# Patient Record
Sex: Female | Born: 2000 | Hispanic: Yes
Health system: Southern US, Community
[De-identification: ages and names within clinical notes are randomized; demographics above are authoritative.]

---
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
## 2021-10-18 IMAGING — US US PELVIS COMPLETE TRANSABD/TRANSVAG W DUPLEX
1 series · 12 of 25 positions shown · non-contrast
Comparison: CT 08/27/2020
COMPARISON: CT 08/27/2020

Addendum:
CLINICAL DATA: Pelvic pain.  Left adnexal mass seen on CT

EXAM:
TRANSABDOMINAL AND TRANSVAGINAL ULTRASOUND OF PELVIS
DOPPLER ULTRASOUND OF OVARIES
TECHNIQUE: Both transabdominal and transvaginal ultrasound examinations of the
pelvis were performed. Transabdominal technique was performed for
global imaging of the pelvis including uterus, ovaries, adnexal
regions, and pelvic cul-de-sac.
It was necessary to proceed with endovaginal exam following the
transabdominal exam to visualize the ovaries. Color and duplex
Doppler ultrasound was utilized to evaluate blood flow to the
ovaries.

[Series 1: us pelvic complete w transvaginal and torsion righ · 12 of 159 slices shown]
[im 7/159]
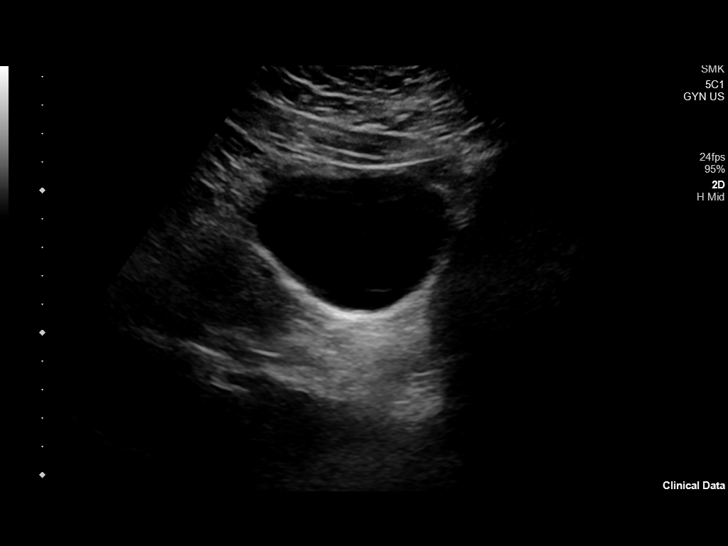
[im 20/159]
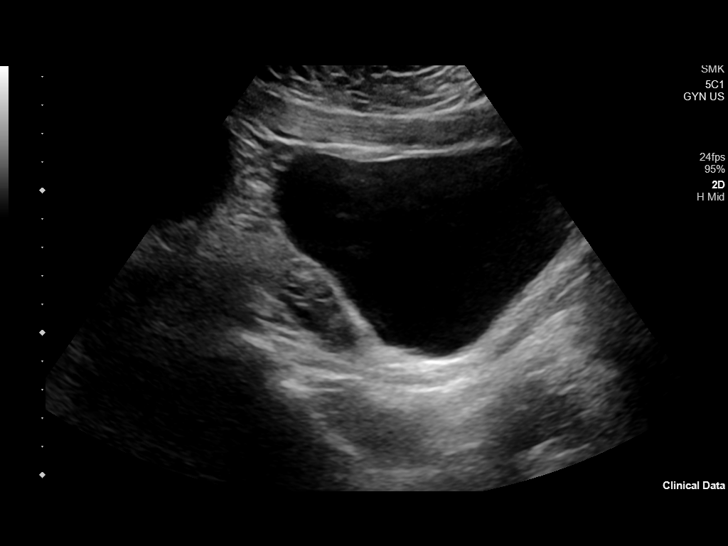
[im 33/159]
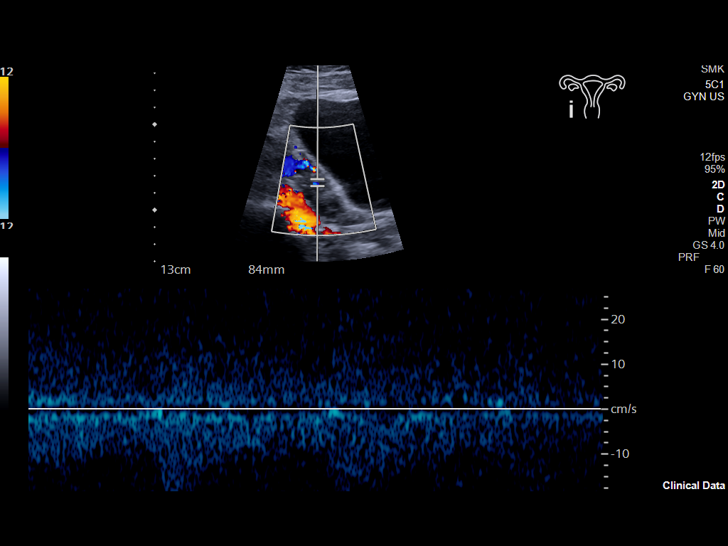
[im 47/159]
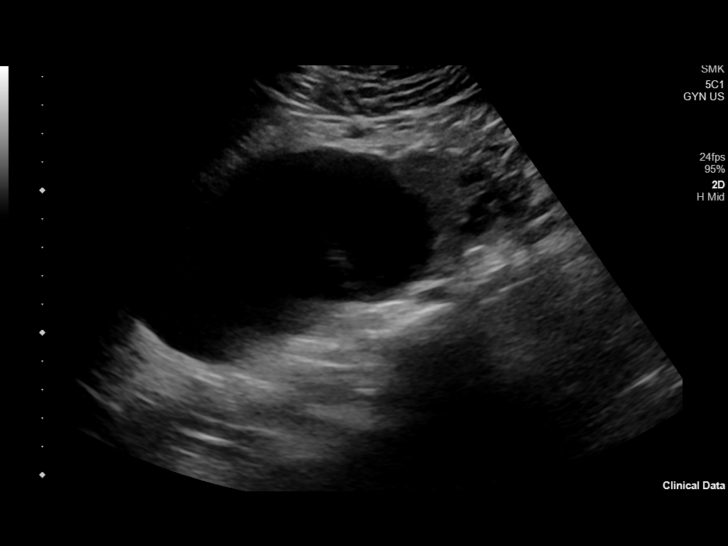
[im 60/159]
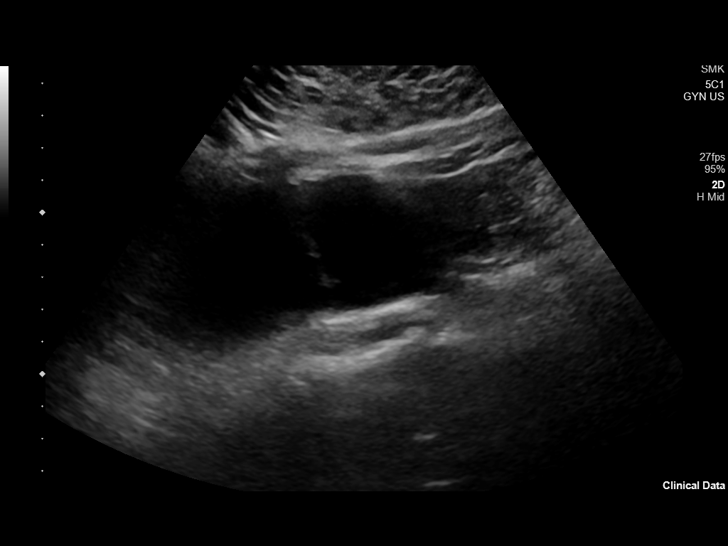
[im 73/159]
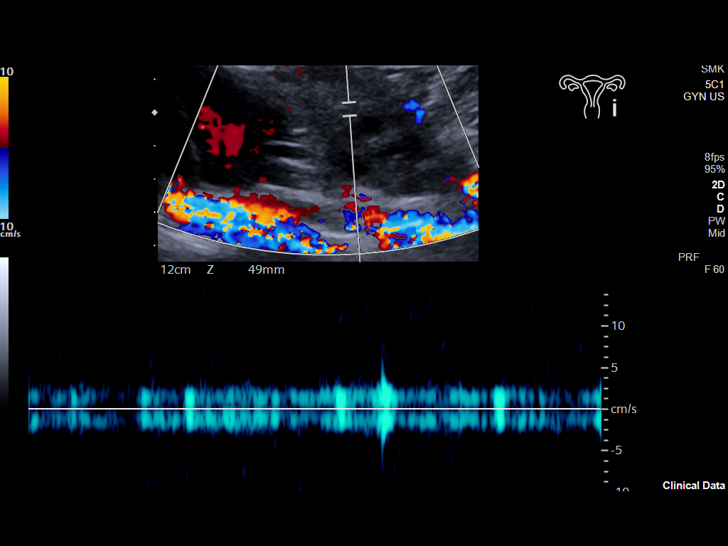
[im 86/159]
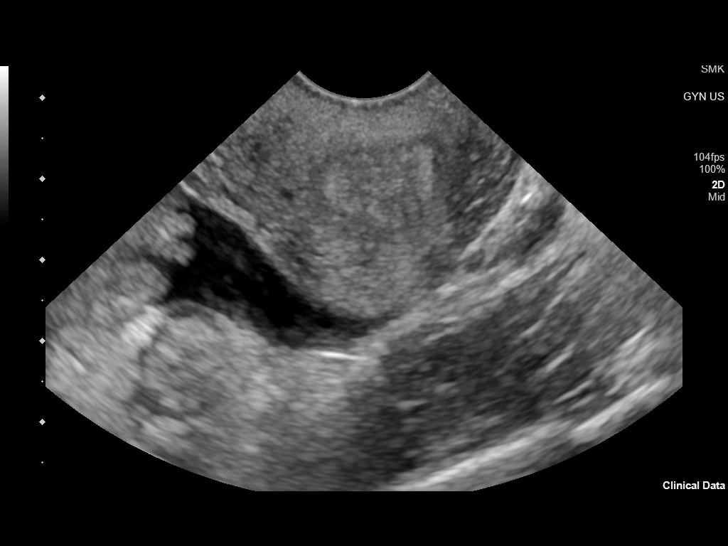
[im 99/159]
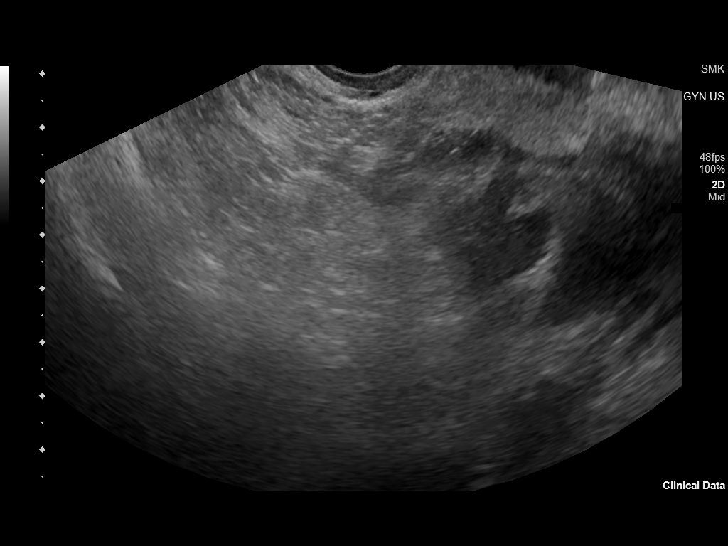
[im 112/159]
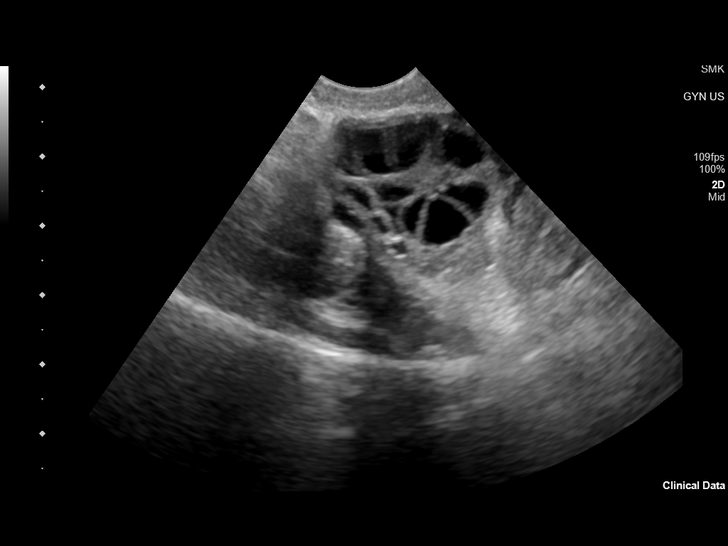
[im 126/159]
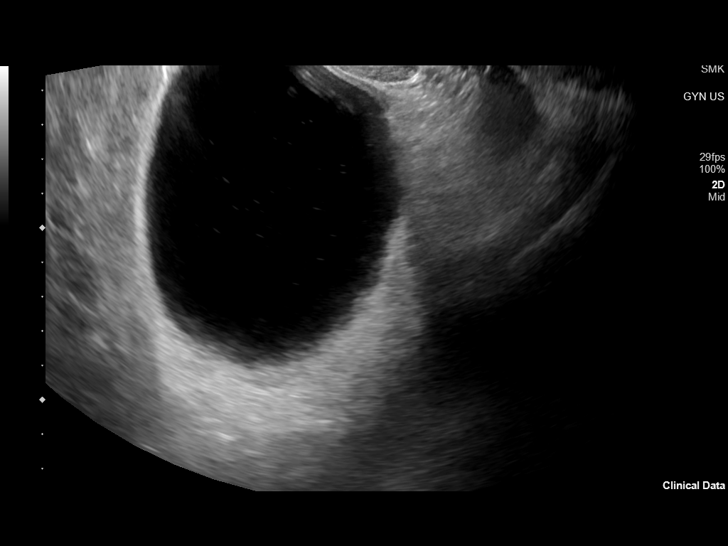
[im 139/159]
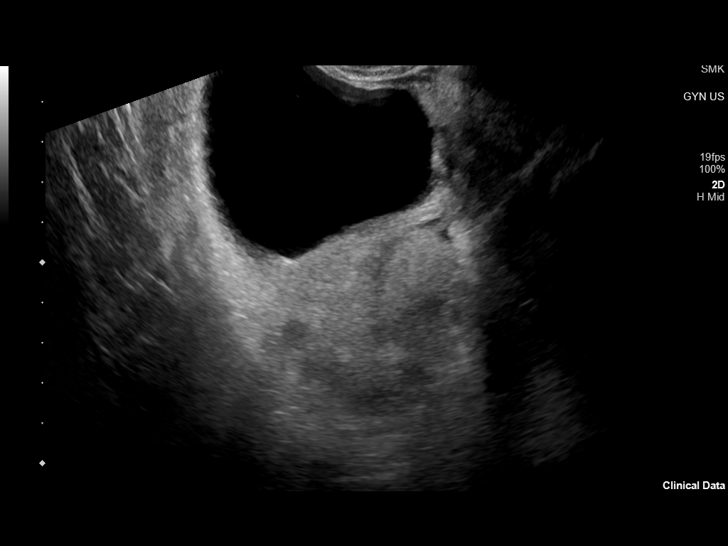
[im 152/159]
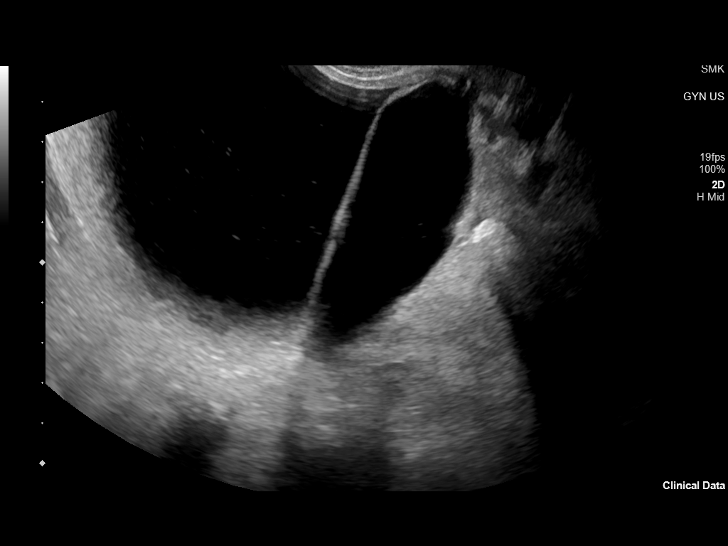

[12 of 25 positions shown; findings below may reference images not displayed]

FINDINGS: Uterus

Measurements: 7.5 x 3.1 x 3.8 cm = volume: 46 mL. No fibroids or
other mass visualized.

Endometrium

Thickness: 7 mm.  No focal abnormality visualized.

Right ovary

Measurements: 4.2 x 2.1 x 2.7 cm = volume: 12 mL. No adnexal mass.
Multiple small follicles predominantly located within the periphery
of the right ovary.

Left ovary

Measurements: 5.6 x 3.7 x 4.0 cm = volume: 43 mL. Large septated
anechoic cyst adjacent to and possibly arising from the left ovary
measures approximately 12.8 x 6.5 x 10.9 cm. Small amount of mobile
debris within the cystic lesion.

Pulsed Doppler evaluation of both ovaries demonstrates normal
low-resistance arterial and venous waveforms within the right ovary.
No arterial waveforms were elicited from the left ovary, however
venous waveforms were visualized.

Other findings

Small volume free fluid within the cul-de-sac.
IMPRESSION: 1. Findings concerning for partial or intermittent left adnexal
torsion. No arterial waveforms were able to be elicited from the
left ovary, however venous waveforms were evident.
2. Large septated cyst measuring up to 12.8 cm adjacent to and
possibly arising from the left ovary.
3. Negative for right adnexal torsion.

ED provider was called at the time of interpretation, who relayed
that this patient has already been admitted under the OB service. A
page to the OB provider has been placed. Documentation of the
communication of the above findings will be included within this
report in the form of an addendum.

ADDENDUM:
Critical Value/emergent results were called by telephone on
08/27/2020 at [DATE] to provider Dr. Micah, who verbally
acknowledged these results.

*** End of Addendum ***
FINDINGS: Uterus

Measurements: 7.5 x 3.1 x 3.8 cm = volume: 46 mL. No fibroids or
other mass visualized.

Endometrium

Thickness: 7 mm.  No focal abnormality visualized.

Right ovary

Measurements: 4.2 x 2.1 x 2.7 cm = volume: 12 mL. No adnexal mass.
Multiple small follicles predominantly located within the periphery
of the right ovary.

Left ovary

Measurements: 5.6 x 3.7 x 4.0 cm = volume: 43 mL. Large septated
anechoic cyst adjacent to and possibly arising from the left ovary
measures approximately 12.8 x 6.5 x 10.9 cm. Small amount of mobile
debris within the cystic lesion.

Pulsed Doppler evaluation of both ovaries demonstrates normal
low-resistance arterial and venous waveforms within the right ovary.
No arterial waveforms were elicited from the left ovary, however
venous waveforms were visualized.

Other findings

Small volume free fluid within the cul-de-sac.
IMPRESSION: 1. Findings concerning for partial or intermittent left adnexal
torsion. No arterial waveforms were able to be elicited from the
left ovary, however venous waveforms were evident.
2. Large septated cyst measuring up to 12.8 cm adjacent to and
possibly arising from the left ovary.
3. Negative for right adnexal torsion.

ED provider was called at the time of interpretation, who relayed
that this patient has already been admitted under the OB service. A
page to the OB provider has been placed. Documentation of the
communication of the above findings will be included within this
report in the form of an addendum.

## 2022-02-21 ENCOUNTER — Inpatient Hospital Stay
Admission: EM | Admit: 2022-02-21 | Discharge: 2022-02-25 | DRG: 343 | Disposition: A | Discharge status: Home / Self Care | Payer: Medicaid Other | Attending: Surgery | Admitting: Surgery

## 2022-02-21 ENCOUNTER — Emergency Department: Payer: Medicaid Other

## 2022-02-21 ENCOUNTER — Other Ambulatory Visit: Payer: Self-pay

## 2022-02-21 DIAGNOSIS — E876 Hypokalemia: Secondary | ICD-10-CM | POA: Diagnosis not present

## 2022-02-21 DIAGNOSIS — K3580 Unspecified acute appendicitis: Principal | ICD-10-CM | POA: Diagnosis present

## 2022-02-21 DIAGNOSIS — Z20822 Contact with and (suspected) exposure to covid-19: Secondary | ICD-10-CM | POA: Diagnosis present

## 2022-02-21 LAB — COMPREHENSIVE METABOLIC PANEL
ALT: 20 U/L (ref 0–44)
AST: 25 U/L (ref 15–41)
Albumin: 4.7 g/dL (ref 3.5–5.0)
Alkaline Phosphatase: 64 U/L (ref 38–126)
Anion gap: 10 (ref 5–15)
BUN: 10 mg/dL (ref 6–20)
CO2: 23 mmol/L (ref 22–32)
Calcium: 9.3 mg/dL (ref 8.9–10.3)
Chloride: 106 mmol/L (ref 98–111)
Creatinine, Ser: 0.64 mg/dL (ref 0.44–1.00)
GFR, Estimated: >60 mL/min (ref >60)
Glucose, Bld: 106 mg/dL — ABNORMAL HIGH (ref 70–99)
Potassium: 3.5 mmol/L (ref 3.5–5.1)
Sodium: 139 mmol/L (ref 135–145)
Total Bilirubin: 0.9 mg/dL (ref 0.3–1.2)
Total Protein: 8.4 g/dL — ABNORMAL HIGH (ref 6.5–8.1)

## 2022-02-21 LAB — CBC WITH DIFFERENTIAL/PLATELET
Abs Immature Granulocytes: 0.08 10*3/uL — ABNORMAL HIGH (ref 0.00–0.07)
Basophils Absolute: 0.1 10*3/uL (ref 0.0–0.1)
Basophils Relative: 0 %
Eosinophils Absolute: 0 10*3/uL (ref 0.0–0.5)
Eosinophils Relative: 0 %
HCT: 38.3 % (ref 36.0–46.0)
Hemoglobin: 12.4 g/dL (ref 12.0–15.0)
Immature Granulocytes: 0 %
Lymphocytes Relative: 3 %
Lymphs Abs: 0.6 10*3/uL — ABNORMAL LOW (ref 0.7–4.0)
MCH: 25.9 pg — ABNORMAL LOW (ref 26.0–34.0)
MCHC: 32.4 g/dL (ref 30.0–36.0)
MCV: 80.1 fL (ref 80.0–100.0)
Monocytes Absolute: 0.9 10*3/uL (ref 0.1–1.0)
Monocytes Relative: 4 %
Neutro Abs: 19.8 10*3/uL — ABNORMAL HIGH (ref 1.7–7.7)
Neutrophils Relative %: 93 %
Platelets: 323 10*3/uL (ref 150–400)
RBC: 4.78 MIL/uL (ref 3.87–5.11)
RDW: 15.3 % (ref 11.5–15.5)
WBC: 21.4 10*3/uL — ABNORMAL HIGH (ref 4.0–10.5)
nRBC: 0 % (ref 0.0–0.2)

## 2022-02-21 LAB — WET PREP, GENITAL
Clue Cells Wet Prep HPF POC: NONE SEEN
Sperm: NONE SEEN
Trich, Wet Prep: NONE SEEN
WBC, Wet Prep HPF POC: <10 (ref <10)
Yeast Wet Prep HPF POC: NONE SEEN

## 2022-02-21 LAB — URINALYSIS, ROUTINE W REFLEX MICROSCOPIC
Bilirubin Urine: NEGATIVE
Glucose, UA: NEGATIVE mg/dL
Hgb urine dipstick: NEGATIVE
Ketones, ur: 80 mg/dL — AB
Leukocytes,Ua: NEGATIVE
Nitrite: NEGATIVE
Protein, ur: 30 mg/dL — AB
Specific Gravity, Urine: 1.026 (ref 1.005–1.030)
pH: 5 (ref 5.0–8.0)

## 2022-02-21 LAB — LIPASE, BLOOD: Lipase: 24 U/L (ref 11–51)

## 2022-02-21 LAB — POC URINE PREG, ED: Preg Test, Ur: NEGATIVE

## 2022-02-21 LAB — SARS CORONAVIRUS 2 BY RT PCR: SARS Coronavirus 2 by RT PCR: NEGATIVE

## 2022-02-21 LAB — LACTIC ACID, PLASMA: Lactic Acid, Venous: 1.5 mmol/L (ref 0.5–1.9)

## 2022-02-21 MED ORDER — SODIUM CHLORIDE 0.9 % IV BOLUS
1000.0000 mL | Freq: Once | INTRAVENOUS | Status: AC
  Administered 2022-02-21: 1000 mL via INTRAVENOUS

## 2022-02-21 MED ORDER — FENTANYL CITRATE PF 50 MCG/ML IJ SOSY
25.0000 ug | PREFILLED_SYRINGE | Freq: Once | INTRAMUSCULAR | Status: AC
  Administered 2022-02-21: 25 ug via INTRAVENOUS
  Filled 2022-02-21: qty 1

## 2022-02-21 MED ORDER — IOHEXOL 300 MG/ML  SOLN
100.0000 mL | Freq: Once | INTRAMUSCULAR | Status: AC | PRN
  Administered 2022-02-21: 100 mL via INTRAVENOUS

## 2022-02-21 MED ORDER — ONDANSETRON HCL 4 MG/2ML IJ SOLN
4.0000 mg | Freq: Once | INTRAMUSCULAR | Status: AC
  Administered 2022-02-21: 4 mg via INTRAVENOUS
  Filled 2022-02-21: qty 2

## 2022-02-21 NOTE — ED Provider Triage Note (Signed)
Emergency Medicine Provider Triage Evaluation Note  Starleen Hagemann , a 21 y.o. female  was evaluated in triage.  Pt complains of epigastric and right lower quadrant abdominal pain.  Reports onset of symptoms last week.  She reports associated nausea, vomiting, and diarrhea.  She denies any fevers, sick contacts, bad food exposure, recent travel.  Review of Systems  Positive: Abd pain, NVD Negative: FCS  Physical Exam  BP 112/80 (BP Location: Left Arm)   Pulse 69   Temp 99 F (37.2 C) (Oral)   Resp 18   SpO2 100%  Gen:   Awake, no distress  NAD Resp:  Normal effort CTA MSK:   Moves extremities without difficulty  ABD:  Soft, nontender  Medical Decision Making  Medically screening exam initiated at 4:02 PM.  Appropriate orders placed.  Dorisann Barabas was informed that the remainder of the evaluation will be completed by another provider, this initial triage assessment does not replace that evaluation, and the importance of remaining in the ED until their evaluation is complete.  Patient to the ED for evaluation of of 1 week complaint of intermittent episodes of epigastric and right lower quadrant abdominal discomfort.  She also reports some associated nausea, vomiting, and diarrhea.   Lissa Hoard, PA-C 02/21/22 1609

## 2022-02-21 NOTE — ED Provider Notes (Signed)
[facility] Provider Note    Event Date/Time   First MD Initiated Contact with Patient 02/21/22 2109     (approximate)   History   Abdominal Pain   HPI  Kathleen Turner is a 21 y.o. female with history of ovarian torsion on the left who comes in with abdominal pain.  I reviewed patient's hospital admission from February 2022 where she had a left ovarian cyst.  Patient reports having sudden onset of lower right abdominal pain associate with multiple episodes of vomiting.  She reports 1 episode of diarrhea she reports this feels similar to when she had ovarian torsion on the left.  She does report being with a partner for the past 5 months denies any recent STD testing but she denies any vaginal discharge or concerns for STDs at this time.   Physical Exam   Triage Vital Signs: ED Triage Vitals  Enc Vitals Group     BP 02/21/22 1559 112/80     Pulse Rate 02/21/22 1559 69     Resp 02/21/22 1559 18     Temp 02/21/22 1559 99 F (37.2 C)     Temp Source 02/21/22 1559 Oral     SpO2 02/21/22 1559 100 %     Weight 02/21/22 1623 145 lb (65.8 kg)     Height 02/21/22 1623 5\' 2"  (1.575 m)     Head Circumference --      Peak Flow --      Pain Score 02/21/22 1623 10     Pain Loc --      Pain Edu? --      Excl. in GC? --     Most recent vital signs: Vitals:   02/21/22 1559  BP: 112/80  Pulse: 69  Resp: 18  Temp: 99 F (37.2 C)  SpO2: 100%     General: Awake, no distress.  CV:  Good peripheral perfusion.  Resp:  Normal effort.  Abd:  No distention.  Other:  Rlq pain    ED Results / Procedures / Treatments   Labs (all labs ordered are listed, but only abnormal results are displayed) Labs Reviewed  COMPREHENSIVE METABOLIC PANEL - Abnormal; Notable for the following components:      Result Value   Glucose, Bld 106 (*)    Total Protein 8.4 (*)    All other components within normal limits  CBC WITH DIFFERENTIAL/PLATELET - Abnormal; Notable for  the following components:   WBC 21.4 (*)    MCH 25.9 (*)    Neutro Abs 19.8 (*)    Lymphs Abs 0.6 (*)    Abs Immature Granulocytes 0.08 (*)    All other components within normal limits  URINALYSIS, ROUTINE W REFLEX MICROSCOPIC - Abnormal; Notable for the following components:   Color, Urine YELLOW (*)    APPearance HAZY (*)    Ketones, ur 80 (*)    Protein, ur 30 (*)    Bacteria, UA FEW (*)    All other components within normal limits  LIPASE, BLOOD  POC URINE PREG, ED     EKG  My interpretation of EKG:  Normal sinus rate of 83 without any ST elevation or T wave inversions, normal intervals  RADIOLOGY I have reviewed the 04/23/22 personally and interpretted no cyst noted    PROCEDURES:  Critical Care performed: No  Procedures   MEDICATIONS ORDERED IN ED: Medications - No data to display   IMPRESSION / MDM / ASSESSMENT AND PLAN / ED  COURSE  I reviewed the triage vital signs and the nursing notes.   Patient's presentation is most consistent with acute presentation with potential threat to life or bodily function.   Differential includes ovarian torsion PID pregnancy appendicitis.  If ultrasound is negative we will proceed with CT imaging to rule out appendicitis.  Patient given some IV fluids IV Zofran IV fentanyl to help with discomfort.  Urine shows some signs of dehydration but no evidence of UTI we will give some fluids Pregnancy test is negative CMP is reassuring normal kidney function CBC shows elevated white count of 21  [place] negative and will get CT.  CT pending and handed off to oncoming team.      FINAL CLINICAL IMPRESSION(S) / ED DIAGNOSES   Final diagnoses:  Abdominal pain, unspecified abdominal location     Rx / DC Orders   ED Discharge Orders     None        Note:  This document was prepared using Dragon voice recognition software and may include unintentional dictation errors.   Concha Se, MD 02/21/22 [phone]

## 2022-02-21 NOTE — ED Provider Notes (Signed)
-----------------------------------------   11:27 PM on 02/21/2022 -----------------------------------------  Assuming care from Dr. Fuller Plan.  In short, Kathleen Turner is a 21 y.o. female with a chief complaint of RLQ abd pain.  Refer to the original H&P for additional details.  The current plan of care is to follow up CT scan.   Clinical Course as of 02/22/22 0045  Sat Feb 22, 2022  0002 I viewed and interpreted the patient's CT scan of the abdomen and pelvis.  There is inflammation in the area of the appendix.  Radiology confirms acute uncomplicated appendicitis.  I consulted by phone with Dr. Claudine Mouton with general surgery.  We discussed the case and he agreed with the plan for me to order Zosyn 3.375 g IV, and he will admit the patient.  I updated the patient who is comfortable with mild right lower quadrant tenderness but no peritonitis.  She is hemodynamically stable. [CF]    Clinical Course User Index [CF] Loleta Rose, MD     Medications  piperacillin-tazobactam (ZOSYN) IVPB 3.375 g (3.375 g Intravenous New Bag/Given 02/22/22 0021)  lactated ringers infusion (has no administration in time range)  piperacillin-tazobactam (ZOSYN) IVPB 3.375 g (has no administration in time range)  HYDROmorphone (DILAUDID) injection 0.5 mg (has no administration in time range)  acetaminophen (TYLENOL) tablet 650 mg (has no administration in time range)    Or  acetaminophen (TYLENOL) suppository 650 mg (has no administration in time range)  zolpidem (AMBIEN) tablet 5 mg (has no administration in time range)  diphenhydrAMINE (BENADRYL) capsule 25 mg (has no administration in time range)    Or  diphenhydrAMINE (BENADRYL) injection 25 mg (has no administration in time range)  ondansetron (ZOFRAN-ODT) disintegrating tablet 4 mg (has no administration in time range)    Or  ondansetron (ZOFRAN) injection 4 mg (has no administration in time range)  sodium chloride 0.9 % bolus 1,000 mL (0 mLs Intravenous  Stopped 02/22/22 0019)  ondansetron (ZOFRAN) injection 4 mg (4 mg Intravenous Given 02/21/22 2248)  fentaNYL (SUBLIMAZE) injection 25 mcg (25 mcg Intravenous Given 02/21/22 2250)  iohexol (OMNIPAQUE) 300 MG/ML solution 100 mL (100 mLs Intravenous Contrast Given 02/21/22 2310)  0.9 %  sodium chloride infusion ( Intravenous New Bag/Given 02/22/22 0023)     ED Discharge Orders     None      Final diagnoses:  Acute appendicitis, uncomplicated     Loleta Rose, MD 02/22/22 0045

## 2022-02-21 NOTE — ED Triage Notes (Signed)
Pt having extreme abd pain down the center of her abd- pt states ut started Friday and has been getting worse since then- pt has had 2 episodes of vomiting today and has had diarrhea- pt states she also feels dizzy- pt states that she had surgery for similar pain last year

## 2022-02-22 ENCOUNTER — Other Ambulatory Visit: Payer: Self-pay

## 2022-02-22 ENCOUNTER — Encounter: Payer: Self-pay | Admitting: Surgery

## 2022-02-22 ENCOUNTER — Observation Stay: Payer: Medicaid Other | Admitting: General Practice

## 2022-02-22 ENCOUNTER — Encounter
Admission: EM | Disposition: A | Discharge status: Home / Self Care | Payer: Self-pay | Source: Home / Self Care | Attending: Surgery

## 2022-02-22 DIAGNOSIS — K3580 Unspecified acute appendicitis: Secondary | ICD-10-CM

## 2022-02-22 HISTORY — PX: XI ROBOTIC LAPAROSCOPIC ASSISTED APPENDECTOMY: SHX6877

## 2022-02-22 LAB — CHLAMYDIA/NGC RT PCR (ARMC ONLY)
Chlamydia Tr: NOT DETECTED
N gonorrhoeae: NOT DETECTED

## 2022-02-22 LAB — LACTIC ACID, PLASMA
Lactic Acid, Venous: 2.3 mmol/L (ref 0.5–1.9)
Lactic Acid, Venous: 6.7 mmol/L (ref 0.5–1.9)

## 2022-02-22 LAB — PROCALCITONIN
Procalcitonin: <0.1 ng/mL
Procalcitonin: <0.1 ng/mL

## 2022-02-22 LAB — HIV ANTIBODY (ROUTINE TESTING W REFLEX): HIV Screen 4th Generation wRfx: NONREACTIVE

## 2022-02-22 SURGERY — APPENDECTOMY, ROBOT-ASSISTED, LAPAROSCOPIC
Anesthesia: General | Site: Abdomen

## 2022-02-22 MED ORDER — ACETAMINOPHEN 325 MG PO TABS
650.0000 mg | ORAL_TABLET | Freq: Four times a day (QID) | ORAL | Status: DC | PRN
  Administered 2022-02-23: 650 mg via ORAL
  Filled 2022-02-22: qty 2

## 2022-02-22 MED ORDER — HYDROMORPHONE HCL 1 MG/ML IJ SOLN
0.5000 mg | INTRAMUSCULAR | Status: DC | PRN
  Administered 2022-02-22: 0.5 mg via INTRAVENOUS
  Filled 2022-02-22: qty 0.5

## 2022-02-22 MED ORDER — ONDANSETRON HCL 4 MG/2ML IJ SOLN
INTRAMUSCULAR | Status: AC
  Filled 2022-02-22: qty 2

## 2022-02-22 MED ORDER — ONDANSETRON HCL 4 MG/2ML IJ SOLN
4.0000 mg | Freq: Four times a day (QID) | INTRAMUSCULAR | Status: DC | PRN
  Administered 2022-02-22 (×2): 4 mg via INTRAVENOUS
  Filled 2022-02-22 (×2): qty 2

## 2022-02-22 MED ORDER — FENTANYL CITRATE (PF) 100 MCG/2ML IJ SOLN
INTRAMUSCULAR | Status: AC
  Filled 2022-02-22: qty 2

## 2022-02-22 MED ORDER — VASOPRESSIN 20 UNIT/ML IV SOLN
INTRAVENOUS | Status: DC | PRN
  Administered 2022-02-22 (×4): 2 [IU] via INTRAVENOUS

## 2022-02-22 MED ORDER — EPHEDRINE SULFATE (PRESSORS) 50 MG/ML IJ SOLN
INTRAMUSCULAR | Status: DC | PRN
  Administered 2022-02-22: 5 mg via INTRAVENOUS

## 2022-02-22 MED ORDER — PIPERACILLIN-TAZOBACTAM 3.375 G IVPB 30 MIN
3.3750 g | Freq: Once | INTRAVENOUS | Status: AC
  Administered 2022-02-22: 3.375 g via INTRAVENOUS
  Filled 2022-02-22: qty 50

## 2022-02-22 MED ORDER — DIAZEPAM 5 MG/ML IJ SOLN
5.0000 mg | Freq: Once | INTRAMUSCULAR | Status: AC
Start: 2022-02-22 — End: 2022-02-22
  Administered 2022-02-22: 5 mg via INTRAVENOUS
  Filled 2022-02-22: qty 2

## 2022-02-22 MED ORDER — OXYCODONE HCL 5 MG/5ML PO SOLN: 5.0000 mg | Freq: Once | ORAL | Status: DC | PRN

## 2022-02-22 MED ORDER — OXYCODONE HCL 5 MG PO TABS: 5.0000 mg | ORAL_TABLET | Freq: Once | ORAL | Status: DC | PRN

## 2022-02-22 MED ORDER — 0.9 % SODIUM CHLORIDE (POUR BTL) OPTIME
TOPICAL | Status: DC | PRN
  Administered 2022-02-22: 500 mL

## 2022-02-22 MED ORDER — ROCURONIUM BROMIDE 100 MG/10ML IV SOLN
INTRAVENOUS | Status: DC | PRN
  Administered 2022-02-22: 5 mg via INTRAVENOUS
  Administered 2022-02-22: 30 mg via INTRAVENOUS

## 2022-02-22 MED ORDER — SODIUM CHLORIDE 0.9 % IV SOLN: Freq: Once | INTRAVENOUS | Status: AC

## 2022-02-22 MED ORDER — ACETAMINOPHEN 10 MG/ML IV SOLN
INTRAVENOUS | Status: AC
Start: 2022-02-22 — End: ?
  Filled 2022-02-22: qty 100

## 2022-02-22 MED ORDER — ACETAMINOPHEN 10 MG/ML IV SOLN
INTRAVENOUS | Status: DC | PRN
  Administered 2022-02-22: 1000 mg via INTRAVENOUS

## 2022-02-22 MED ORDER — PHENYLEPHRINE HCL (PRESSORS) 10 MG/ML IV SOLN
INTRAVENOUS | Status: AC
  Filled 2022-02-22: qty 1

## 2022-02-22 MED ORDER — DIPHENHYDRAMINE HCL 50 MG/ML IJ SOLN: 25.0000 mg | Freq: Four times a day (QID) | INTRAMUSCULAR | Status: DC | PRN

## 2022-02-22 MED ORDER — LACTATED RINGERS IV SOLN: INTRAVENOUS | Status: DC | PRN

## 2022-02-22 MED ORDER — PROPOFOL 10 MG/ML IV BOLUS
INTRAVENOUS | Status: DC | PRN
  Administered 2022-02-22: 120 mg via INTRAVENOUS

## 2022-02-22 MED ORDER — DIPHENHYDRAMINE HCL 25 MG PO CAPS: 25.0000 mg | ORAL_CAPSULE | Freq: Four times a day (QID) | ORAL | Status: DC | PRN

## 2022-02-22 MED ORDER — ACETAMINOPHEN 650 MG RE SUPP: 650.0000 mg | Freq: Four times a day (QID) | RECTAL | Status: DC | PRN

## 2022-02-22 MED ORDER — SUCCINYLCHOLINE CHLORIDE 200 MG/10ML IV SOSY
PREFILLED_SYRINGE | INTRAVENOUS | Status: DC | PRN
  Administered 2022-02-22: 140 mg via INTRAVENOUS

## 2022-02-22 MED ORDER — DEXAMETHASONE SODIUM PHOSPHATE 10 MG/ML IJ SOLN
INTRAMUSCULAR | Status: AC
  Filled 2022-02-22: qty 1

## 2022-02-22 MED ORDER — PHENYLEPHRINE HCL-NACL 20-0.9 MG/250ML-% IV SOLN
INTRAVENOUS | Status: DC | PRN
  Administered 2022-02-22: 50 ug/min via INTRAVENOUS

## 2022-02-22 MED ORDER — ONDANSETRON 4 MG PO TBDP: 4.0000 mg | ORAL_TABLET | Freq: Four times a day (QID) | ORAL | Status: DC | PRN

## 2022-02-22 MED ORDER — FENTANYL CITRATE (PF) 100 MCG/2ML IJ SOLN
INTRAMUSCULAR | Status: DC | PRN
  Administered 2022-02-22: 50 ug via INTRAVENOUS
  Administered 2022-02-22 (×2): 25 ug via INTRAVENOUS
  Administered 2022-02-22: 100 ug via INTRAVENOUS

## 2022-02-22 MED ORDER — IPRATROPIUM-ALBUTEROL 0.5-2.5 (3) MG/3ML IN SOLN
RESPIRATORY_TRACT | Status: AC
  Filled 2022-02-22: qty 3

## 2022-02-22 MED ORDER — ONDANSETRON HCL 4 MG/2ML IJ SOLN
INTRAMUSCULAR | Status: DC | PRN
  Administered 2022-02-22: 4 mg via INTRAVENOUS

## 2022-02-22 MED ORDER — LACTATED RINGERS IV SOLN: INTRAVENOUS | Status: DC

## 2022-02-22 MED ORDER — BUPIVACAINE-EPINEPHRINE (PF) 0.25% -1:200000 IJ SOLN
INTRAMUSCULAR | Status: DC | PRN
  Administered 2022-02-22: 30 mL via PERINEURAL

## 2022-02-22 MED ORDER — VASOPRESSIN 20 UNIT/ML IV SOLN
INTRAVENOUS | Status: AC
  Filled 2022-02-22: qty 1

## 2022-02-22 MED ORDER — SODIUM CHLORIDE 0.9 % IV SOLN: INTRAVENOUS | Status: DC

## 2022-02-22 MED ORDER — SUCCINYLCHOLINE CHLORIDE 200 MG/10ML IV SOSY
PREFILLED_SYRINGE | INTRAVENOUS | Status: AC
  Filled 2022-02-22: qty 10

## 2022-02-22 MED ORDER — LIDOCAINE HCL (CARDIAC) PF 100 MG/5ML IV SOSY
PREFILLED_SYRINGE | INTRAVENOUS | Status: DC | PRN
  Administered 2022-02-22: 100 mg via INTRAVENOUS

## 2022-02-22 MED ORDER — MIDAZOLAM HCL 2 MG/2ML IJ SOLN
INTRAMUSCULAR | Status: DC | PRN
  Administered 2022-02-22: 2 mg via INTRAVENOUS

## 2022-02-22 MED ORDER — IPRATROPIUM-ALBUTEROL 0.5-2.5 (3) MG/3ML IN SOLN
3.0000 mL | Freq: Once | RESPIRATORY_TRACT | Status: AC
Start: 2022-02-22 — End: 2022-02-22
  Administered 2022-02-22: 3 mL via RESPIRATORY_TRACT

## 2022-02-22 MED ORDER — KETOROLAC TROMETHAMINE 30 MG/ML IJ SOLN
INTRAMUSCULAR | Status: DC | PRN
  Administered 2022-02-22: 30 mg via INTRAVENOUS

## 2022-02-22 MED ORDER — SODIUM CHLORIDE 0.9 % IV BOLUS
500.0000 mL | Freq: Once | INTRAVENOUS | Status: AC
  Administered 2022-02-22: 500 mL via INTRAVENOUS

## 2022-02-22 MED ORDER — FENTANYL CITRATE (PF) 100 MCG/2ML IJ SOLN: 25.0000 ug | INTRAMUSCULAR | Status: DC | PRN

## 2022-02-22 MED ORDER — SUGAMMADEX SODIUM 200 MG/2ML IV SOLN
INTRAVENOUS | Status: DC | PRN
  Administered 2022-02-22: 200 mg via INTRAVENOUS

## 2022-02-22 MED ORDER — ROCURONIUM BROMIDE 10 MG/ML (PF) SYRINGE
PREFILLED_SYRINGE | INTRAVENOUS | Status: AC
  Filled 2022-02-22: qty 10

## 2022-02-22 MED ORDER — ZOLPIDEM TARTRATE 5 MG PO TABS
5.0000 mg | ORAL_TABLET | Freq: Every evening | ORAL | Status: DC | PRN
  Administered 2022-02-23: 5 mg via ORAL
  Filled 2022-02-22: qty 1

## 2022-02-22 MED ORDER — PIPERACILLIN-TAZOBACTAM 3.375 G IVPB
3.3750 g | Freq: Three times a day (TID) | INTRAVENOUS | Status: DC
  Administered 2022-02-22 – 2022-02-25 (×9): 3.375 g via INTRAVENOUS
  Filled 2022-02-22 (×9): qty 50

## 2022-02-22 MED ORDER — PHENYLEPHRINE HCL (PRESSORS) 10 MG/ML IV SOLN
INTRAVENOUS | Status: DC | PRN
  Administered 2022-02-22: 320 ug via INTRAVENOUS
  Administered 2022-02-22: 160 ug via INTRAVENOUS
  Administered 2022-02-22: 80 ug via INTRAVENOUS
  Administered 2022-02-22 (×2): 160 ug via INTRAVENOUS
  Administered 2022-02-22: 320 ug via INTRAVENOUS

## 2022-02-22 MED ORDER — DEXAMETHASONE SODIUM PHOSPHATE 10 MG/ML IJ SOLN
INTRAMUSCULAR | Status: DC | PRN
  Administered 2022-02-22: 10 mg via INTRAVENOUS

## 2022-02-22 MED ORDER — EPHEDRINE 5 MG/ML INJ
INTRAVENOUS | Status: AC
  Filled 2022-02-22: qty 5

## 2022-02-22 SURGICAL SUPPLY — 52 items
BAG PRESSURE INF REUSE 3000 (BAG) IMPLANT
BLADE CLIPPER SURG (BLADE) IMPLANT
CANNULA REDUC XI 12-8 STAPL (CANNULA) ×1
CANNULA REDUCER 12-8 DVNC XI (CANNULA) ×1 IMPLANT
COVER TIP SHEARS 8 DVNC (MISCELLANEOUS) ×1 IMPLANT
COVER TIP SHEARS 8MM DA VINCI (MISCELLANEOUS) ×1
CUTTER FLEX LINEAR 45M (STAPLE) IMPLANT
DERMABOND ADVANCED (GAUZE/BANDAGES/DRESSINGS) ×1
DERMABOND ADVANCED .7 DNX12 (GAUZE/BANDAGES/DRESSINGS) ×1 IMPLANT
DRAPE ARM DVNC X/XI (DISPOSABLE) ×3 IMPLANT
DRAPE COLUMN DVNC XI (DISPOSABLE) ×1 IMPLANT
DRAPE DA VINCI XI ARM (DISPOSABLE) ×3
DRAPE DA VINCI XI COLUMN (DISPOSABLE) ×1
GLOVE ORTHO TXT STRL SZ7.5 (GLOVE) ×4 IMPLANT
GOWN STRL REUS W/ TWL LRG LVL3 (GOWN DISPOSABLE) ×1 IMPLANT
GOWN STRL REUS W/ TWL XL LVL3 (GOWN DISPOSABLE) ×2 IMPLANT
GOWN STRL REUS W/TWL LRG LVL3 (GOWN DISPOSABLE) ×1
GOWN STRL REUS W/TWL XL LVL3 (GOWN DISPOSABLE) ×2
GRASPER SUT TROCAR 14GX15 (MISCELLANEOUS) IMPLANT
IRRIGATION STRYKERFLOW (MISCELLANEOUS) IMPLANT
IRRIGATOR STRYKERFLOW (MISCELLANEOUS) ×2
IRRIGATOR SUCT 8 DISP DVNC XI (IRRIGATION / IRRIGATOR) IMPLANT
IRRIGATOR SUCTION 8MM XI DISP (IRRIGATION / IRRIGATOR)
IV NS IRRIG 3000ML ARTHROMATIC (IV SOLUTION) ×1 IMPLANT
KIT PINK PAD W/HEAD ARE REST (MISCELLANEOUS) ×2 IMPLANT
KIT PINK PAD W/HEAD ARM REST (MISCELLANEOUS) ×1 IMPLANT
KIT TURNOVER KIT A (KITS) ×2 IMPLANT
LABEL OR SOLS (LABEL) ×2 IMPLANT
MANIFOLD NEPTUNE II (INSTRUMENTS) ×2 IMPLANT
NDL INSUFFLATION 14GA 120MM (NEEDLE) IMPLANT
NEEDLE HYPO 22GX1.5 SAFETY (NEEDLE) ×2 IMPLANT
NEEDLE INSUFFLATION 14GA 120MM (NEEDLE) ×2 IMPLANT
NS IRRIG 500ML POUR BTL (IV SOLUTION) ×2 IMPLANT
PACK LAP CHOLECYSTECTOMY (MISCELLANEOUS) ×2 IMPLANT
RELOAD 45 VASCULAR/THIN (ENDOMECHANICALS) IMPLANT
RELOAD STAPLE 45 2.5 WHT GRN (ENDOMECHANICALS) IMPLANT
SEAL CANN UNIV 5-8 DVNC XI (MISCELLANEOUS) ×3 IMPLANT
SEAL XI 5MM-8MM UNIVERSAL (MISCELLANEOUS) ×3
SET TUBE SMOKE EVAC HIGH FLOW (TUBING) ×2 IMPLANT
SPIKE FLUID TRANSFER (MISCELLANEOUS) ×2 IMPLANT
STAPLER CANNULA SEAL DVNC XI (STAPLE) ×1 IMPLANT
STAPLER CANNULA SEAL XI (STAPLE) ×1
SUT MNCRL 4-0 (SUTURE) ×1
SUT MNCRL 4-0 27XMFL (SUTURE) ×1
SUT VIC AB 0 SH 27 (SUTURE) ×2 IMPLANT
SUT VICRYL 0 AB UR-6 (SUTURE) ×2 IMPLANT
SUTURE MNCRL 4-0 27XMF (SUTURE) ×1 IMPLANT
SYS BAG RETRIEVAL 10MM (BASKET) ×2
SYSTEM BAG RETRIEVAL 10MM (BASKET) ×1 IMPLANT
TRAP FLUID SMOKE EVACUATOR (MISCELLANEOUS) ×2 IMPLANT
TROCAR Z-THREAD FIOS 11X100 BL (TROCAR) ×2 IMPLANT
WATER STERILE IRR 500ML POUR (IV SOLUTION) ×2 IMPLANT

## 2022-02-22 NOTE — Progress Notes (Signed)
Rapid Response Event Note   Reason for Call : Red MEWS, low blood pressure    Initial Focused Assessment:   Awake and alert walking back from bathroom no obvious signs of distress, according to nurses in room, she just got back from OR.     Interventions:  Bolus of normal saline and continue to monitor  Plan of Care:  Will continue to monitor and keep in contact with primary nurse.    Event Summary:   MD Notified:  Call Time: Arrival Time: End Time:  Barnie Alderman, RN

## 2022-02-22 NOTE — Progress Notes (Signed)
Pt has been sleeping comfortably all morning, no complaints. Dr. Claudine Mouton came in and explained procedure, pt agreeable. Right after he left, pt started shaking in bed saying, "I'm so cold!" Denied pain. Notified Dr. Claudine Mouton who was still here charting. He came and saw her and ordered Valium.   10 minutes later, pt's shaking increased and her heart rate increased to 140's and she started vomiting. Immediately called the charge nurse to come, which she did and assessed the patient and paged Dr. Claudine Mouton. Dr. Claudine Mouton came right away and by that time, the patient had had the Valium in her system for 3 min. She calmed down within a minute of his arrival and by the time he left, she had stopped shaking completely and was relaxed. Her heart rate continues  to slowly come down and 30 min later is still in the 120's. Will cont. To keep a close eye on her. No c/o pain during this episode however.

## 2022-02-22 NOTE — Anesthesia Postprocedure Evaluation (Signed)
Anesthesia Post Note  Patient: Education officer, environmental) Performed: XI ROBOTIC LAPAROSCOPIC ASSISTED APPENDECTOMY (Abdomen)  Patient location during evaluation: PACU Anesthesia Type: General Level of consciousness: awake and alert Pain management: pain level controlled Vital Signs Assessment: post-procedure vital signs reviewed and stable Respiratory status: spontaneous breathing, nonlabored ventilation, respiratory function stable and patient connected to nasal cannula oxygen Cardiovascular status: blood pressure returned to baseline and stable Postop Assessment: no apparent nausea or vomiting Anesthetic complications: no   No notable events documented.   Last Vitals:  Vitals:   02/22/22 1830 02/22/22 1845  BP: (!) 84/55 (!) 84/59  Pulse: (!) 127 (!) 113  Resp: (!) 30 16  Temp: 36.8 C   SpO2: 91% 93%    Last Pain:  Vitals:   02/22/22 1830  TempSrc:   PainSc: 0-No pain                 Stephanie Coup

## 2022-02-22 NOTE — ED Notes (Signed)
RN reached out to floor coverage provider to report critical lactic result. Floor Coverage provider Valente David made aware and states to reach out to Dr. Juleen China to pt being a surgical pt. Dr. Campbell Lerner notified.

## 2022-02-22 NOTE — Progress Notes (Signed)
Called to patient's room again by charge nurse Raynelle Fanning, regarding continued hypotension, patient still awake and alert and keenly responsive, rechecked blood pressure while in room it was 88/55, with a MAP of 63. Check abdomen and lower back for any bruising or signs of internal bleeding, did not observe any signs of bleeding, will continue to monitor.

## 2022-02-22 NOTE — ED Notes (Signed)
Pt presents to ED with abdominal pain. Pt states she has been having pain since Friday. Describes pain as a cramping sensation in RLQ. Pt also c/o Nausea and Emesis and has had some diarrhea. Pt states she has had emesis X3 today.    Tenderness noted upon palpation in RLQ.

## 2022-02-22 NOTE — Progress Notes (Signed)
Notified by [facility] Charge RN patient had arrived to the floor from PACU with a decreased bp as documented. Notified Operator to call a Rapid Response. Rapid response paged and overhead announced.

## 2022-02-22 NOTE — ED Notes (Signed)
Pt currently vomiting. Requesting nausea medication at this time.

## 2022-02-22 NOTE — Transfer of Care (Signed)
Immediate Anesthesia Transfer of Care Note  Patient: Kathleen Turner  Procedure(s) Performed: XI ROBOTIC LAPAROSCOPIC ASSISTED APPENDECTOMY (Abdomen)  Patient Location: PACU  Anesthesia Type:General  Level of Consciousness: awake, alert  and oriented  Airway & Oxygen Therapy: Patient Spontanous Breathing and Patient connected to nasal cannula oxygen  Post-op Assessment: Report given to RN and Post -op Vital signs reviewed and stable  Post vital signs: Reviewed and stable  Last Vitals:  Vitals Value Taken Time  BP 111/47 02/22/22 1806  Temp 36.5 C 02/22/22 1801  Pulse 133 02/22/22 1807  Resp 15 02/22/22 1808  SpO2 90 % 02/22/22 1807  Vitals shown include unvalidated device data.  Last Pain:  Vitals:   02/22/22 1801  TempSrc:   PainSc: Asleep         Complications: No notable events documented.

## 2022-02-22 NOTE — Op Note (Signed)
Robotic appendectomy  Pre-operative Diagnosis: Acute appendicitis  Post-operative Diagnosis: same, hemorrhagic.   Surgeon: Campbell Lerner, M.D., FACS  Anesthesia: General  Findings: Appendix wrapped with omentum, hemorrhagic in gross external appearance.  Estimated Blood Loss: 15 mL         Specimens: Appendix          Complications: none              Procedure Details  The patient was seen again in the Holding Room. The benefits, complications, treatment options, and expected outcomes were discussed with the patient. The risks of bleeding, infection, recurrence of symptoms, failure to resolve symptoms, unanticipated injury, prosthetic placement, prosthetic infection, any of which could require further surgery were reviewed with the patient. The likelihood of improving the patient's symptoms with return to their baseline status is expected.  The patient and/or family concurred with the proposed plan, giving informed consent.  The patient was taken to Operating Room, identified and the procedure verified.    Prior to the induction of general anesthesia, antibiotic prophylaxis was administered. VTE prophylaxis was in place.  General anesthesia was then administered and tolerated well. After the induction, the patient was positioned in the supine position and the abdomen was prepped with  Chloraprep and draped in the sterile fashion.  A Time Out was held and the above information confirmed.  After local infiltration of quarter percent Marcaine with epinephrine, stab incision was made left upper quadrant.  Just below the costal margin approximately midclavicular line the Veress needle is passed with sensation of the layers to penetrate the abdominal wall and into the peritoneum.  Saline drop test is confirmed peritoneal placement.  Insufflation is initiated with carbon dioxide to pressures of 15 mmHg.  With local anesthetic infiltration, a left lower quadrant incision is made, and an optical  12 mm trocar is passed into the peritoneal cavity under direct visualization.  Two additional 8.5 mm robotic trochars placed in the  left abdominal wall under direct visualization. Using a Tips up, & force bipolar graspers and later with monopolar scissors I proceeded with dissecting out the soft tissues adjacent to the cecum and appendix to fully identify the appendix, and mobilize it to the cecal junction.  The mesoappendix was carefully divided utilizing bipolar cautery, monopolar cautery and scissors. With the appendiceal cecal junction fully isolated, the appendiceal base was crimped, a double ligature of 0 Vicryl was utilized to ligate the base of the appendix, and the appendix was divided with monopolar scissors, the appendiceal mucosa was then fulgurated with the same.  The proximal/cecal stump ligature appeared to have come loose from the stump. I utilized the 0 Vicryl to dunk the appendiceal stump into the cecal serosa, burying the stump with a figure-of-eight of sutures, followed by a Lembert suture.   We then undocked the robot and proceeded with completing the procedure laparoscopically. We then placed the appendix in a retrieval bag and withdrew it out the largest port site. We closed the largest port site utilizing PMI and cone with a 0 Vicryl under direct visualization.  Due to the recent febrile changes in the patient, and the appearance of the appendix being hemorrhagic, I felt it prudent to irrigate with over 2 L of normal saline solution. The abdomen was then desufflated and the trochars removed. Incisions were then irrigated and closed with subcuticulars of 4-0 Monocryl.  Skin sealed with Dermabond.  Patient tolerated procedure well.    Campbell Lerner M.D., [facility] 02/22/2022  6:00 PM

## 2022-02-22 NOTE — Progress Notes (Signed)
Date and time results received: 02/22/22 2103  Reported by: Geraldine Contras, LAB Reported to: Pamelia Hoit, RN   Test: Lactic acid Critical Value: 6.7  Name of Provider Notified: [place]  Orders Received? Or Actions Taken?: monitor, repeat lactic acid in am

## 2022-02-22 NOTE — Anesthesia Preprocedure Evaluation (Signed)
Anesthesia Evaluation  Patient identified by MRN, date of birth, ID band Patient awake    Reviewed: Allergy & Precautions, NPO status , Patient's Chart, lab work & pertinent test results  History of Anesthesia Complications Negative for: history of anesthetic complications  Airway Mallampati: II  TM Distance: >3 FB Neck ROM: full    Dental  (+) Chipped   Pulmonary neg pulmonary ROS,    Pulmonary exam normal        Cardiovascular negative cardio ROS Normal cardiovascular exam     Neuro/Psych negative neurological ROS  negative psych ROS   GI/Hepatic negative GI ROS, Neg liver ROS,   Endo/Other  negative endocrine ROS  Renal/GU      Musculoskeletal   Abdominal   Peds  Hematology negative hematology ROS (+)   Anesthesia Other Findings History reviewed. No pertinent past medical history.  Past Surgical History: 08/27/2020: LAPAROSCOPIC OVARIAN CYSTECTOMY; Left     Comment:  Procedure: LAPAROSCOPIC DETORSION LEFT ADNEXAL CYST LEFT              ADNEXAL CYSTECTOMY;  Surgeon: Schermerhorn, Ihor Austin, MD;              Location: [facility] ORS;  Service: Gynecology;  Laterality:               Left;  BMI    Body Mass Index: 25.61 kg/m      Reproductive/Obstetrics negative OB ROS                             Anesthesia Physical Anesthesia Plan  ASA: 2  Anesthesia Plan: General   Post-op Pain Management:    Induction:   PONV Risk Score and Plan: 3 and Ondansetron, Dexamethasone, Midazolam and Treatment may vary due to age or medical condition  Airway Management Planned:   Additional Equipment:   Intra-op Plan:   Post-operative Plan:   Informed Consent: I have reviewed the patients History and Physical, chart, labs and discussed the procedure including the risks, benefits and alternatives for the proposed anesthesia with the patient or authorized representative who has indicated his/her  understanding and acceptance.     Dental Advisory Given  Plan Discussed with: Anesthesiologist, CRNA and Surgeon  Anesthesia Plan Comments:         Anesthesia Quick Evaluation

## 2022-02-22 NOTE — Anesthesia Procedure Notes (Signed)
Procedure Name: Intubation Date/Time: 02/22/2022 4:27 PM  Performed by: Estanislado Emms, CRNAPre-anesthesia Checklist: Patient identified, Patient being monitored, Timeout performed, Emergency Drugs available and Suction available Patient Re-evaluated:Patient Re-evaluated prior to induction Oxygen Delivery Method: Circle system utilized Preoxygenation: Pre-oxygenation with 100% oxygen Induction Type: IV induction Laryngoscope Size: McGraph and 3 Grade View: Grade I Tube type: Oral Tube size: 7.0 mm Number of attempts: 1 Airway Equipment and Method: Stylet Placement Confirmation: ETT inserted through vocal cords under direct vision, positive ETCO2 and breath sounds checked- equal and bilateral Secured at: 21 cm Tube secured with: Tape Dental Injury: Teeth and Oropharynx as per pre-operative assessment

## 2022-02-22 NOTE — Progress Notes (Signed)
  Chaplain On-Call responded to Rapid Response notification at 2024 hours.  The patient was being attended by the medical team at bedside. RN stated that the concern was low blood pressure for the patient, who is recovering well now.  Chaplain is available if further support is requested.  Chaplain Evelena Peat M.Div., [facility]

## 2022-02-22 NOTE — Progress Notes (Signed)
OR transport here to pick up pt.

## 2022-02-22 NOTE — H&P (Signed)
Patient ID: Kathleen Turner, female   DOB: March 15, 2001, 21 y.o.   MRN: [id number]  Chief Complaint: Right abdominal pain  History of Present Illness Kathleen Turner is a 21 y.o. female with acute onset of midline epigastric abdominal pain with progression to right lower quadrant.  Prior history of ovarian torsion with laparoscopic detorsion.  Past Medical History History reviewed. No pertinent past medical history.    Past Surgical History:  Procedure Laterality Date   LAPAROSCOPIC OVARIAN CYSTECTOMY Left 08/27/2020   Procedure: LAPAROSCOPIC DETORSION LEFT ADNEXAL CYST LEFT ADNEXAL CYSTECTOMY;  Surgeon: Schermerhorn, Ihor Austin, MD;  Location: [facility] ORS;  Service: Gynecology;  Laterality: Left;    No Known Allergies  Current Facility-Administered Medications  Medication Dose Route Frequency Provider Last Rate Last Admin   0.9 %  sodium chloride infusion   Intravenous Continuous Campbell Lerner, MD 150 mL/hr at 02/22/22 0900 New Bag at 02/22/22 0900   acetaminophen (TYLENOL) tablet 650 mg  650 mg Oral Q6H PRN Campbell Lerner, MD       Or   acetaminophen (TYLENOL) suppository 650 mg  650 mg Rectal Q6H PRN Campbell Lerner, MD       diphenhydrAMINE (BENADRYL) capsule 25 mg  25 mg Oral Q6H PRN Campbell Lerner, MD       Or   diphenhydrAMINE (BENADRYL) injection 25 mg  25 mg Intravenous Q6H PRN Campbell Lerner, MD       HYDROmorphone (DILAUDID) injection 0.5 mg  0.5 mg Intravenous Q3H PRN Campbell Lerner, MD   0.5 mg at 02/22/22 0243   lactated ringers infusion   Intravenous Continuous Campbell Lerner, MD   Paused at 02/22/22 0503   ondansetron (ZOFRAN-ODT) disintegrating tablet 4 mg  4 mg Oral Q6H PRN Campbell Lerner, MD       Or   ondansetron [facility]) injection 4 mg  4 mg Intravenous Q6H PRN Campbell Lerner, MD   4 mg at 02/22/22 0456   piperacillin-tazobactam (ZOSYN) IVPB 3.375 g  3.375 g Intravenous Q8H Campbell Lerner, MD   Stopped at 02/22/22 0903   zolpidem (AMBIEN) tablet 5  mg  5 mg Oral QHS PRN Campbell Lerner, MD       No current outpatient medications on file.    Family History No family history on file.    Social History Social History   Tobacco Use   Smoking status: Never   Smokeless tobacco: Never  Substance Use Topics   Alcohol use: Never   Drug use: Never        Review of Systems  Constitutional:  Negative for chills and fever.  HENT: Negative.    Eyes: Negative.   Respiratory: Negative.    Cardiovascular: Negative.   Gastrointestinal:  Positive for abdominal pain and nausea. Negative for constipation, diarrhea and vomiting.  Genitourinary: Negative.   Skin: Negative.   Neurological: Negative.   Psychiatric/Behavioral: Negative.        Physical Exam Blood pressure (!) 107/58, pulse 96, temperature 98.6 F (37 C), temperature source Oral, resp. rate 16, height 5\' 2"  (1.575 m), weight 63.5 kg, SpO2 100 %. Last Weight  Most recent update: 02/21/2022  4:24 PM    Weight  63.5 kg (140 lb)             CONSTITUTIONAL: Well developed, and nourished, appropriately responsive and aware without distress.   EYES: Sclera non-icteric.   EARS, NOSE, MOUTH AND THROAT:  The oropharynx is clear. Oral mucosa is pink and moist.   Hearing is intact to voice.  NECK: Trachea is midline, and there is no jugular venous distension.  LYMPH NODES:  Lymph nodes in the neck are not enlarged. RESPIRATORY:  Lungs are clear, and breath sounds are equal bilaterally. Normal respiratory effort without pathologic use of accessory muscles. CARDIOVASCULAR: Heart is regular in rate and rhythm. GI: The abdomen is tender to palpation in the suprapubic area of the right lower quadrant, minimal voluntary guarding otherwise soft, nontender, and nondistended. There were no palpable masses. I did not appreciate hepatosplenomegaly. There were normal bowel sounds. MUSCULOSKELETAL:  Symmetrical muscle tone appreciated in all four extremities.    SKIN: Skin turgor is  normal. No pathologic skin lesions appreciated.  NEUROLOGIC:  Motor and sensation appear grossly normal.  Cranial nerves are grossly without defect. PSYCH:  Alert and oriented to person, place and time. Affect is appropriate for situation.  Data Reviewed I have personally reviewed what is currently available of the patient's imaging, recent labs and medical records.   Labs:     Latest Ref Rng & Units 02/21/2022    4:07 PM 08/28/2020    5:49 AM 08/27/2020   11:39 AM  CBC  WBC 4.0 - 10.5 K/uL 21.4  13.5  11.1   Hemoglobin 12.0 - 15.0 g/dL 70.6  23.7  62.8   Hematocrit 36.0 - 46.0 % 38.3  34.1  36.7   Platelets 150 - 400 K/uL 323  309  330       Latest Ref Rng & Units 02/21/2022    4:07 PM 08/27/2020   11:39 AM  CMP  Glucose 70 - 99 mg/dL 315  176   BUN 6 - 20 mg/dL 10  7   Creatinine 1.60 - 1.00 mg/dL 7.37  1.06   Sodium 269 - 145 mmol/L 139  137   Potassium 3.5 - 5.1 mmol/L 3.5  3.6   Chloride 98 - 111 mmol/L 106  102   CO2 22 - 32 mmol/L 23  24   Calcium 8.9 - 10.3 mg/dL 9.3  9.6   Total Protein 6.5 - 8.1 g/dL 8.4  8.8   Total Bilirubin 0.3 - 1.2 mg/dL 0.9  0.5   Alkaline Phos 38 - 126 U/L 64  77   AST 15 - 41 U/L 25  29   ALT 0 - 44 U/L 20  31    Venous lactate is 1.5 and increasing to 2.3 after 6 hours without IV fluid aside from a bolus.   Imaging:  Within last 24 hrs: CT ABDOMEN PELVIS W CONTRAST  Result Date: 02/21/2022 CLINICAL DATA:  Abdominal pain and vomiting EXAM: CT ABDOMEN AND PELVIS WITH CONTRAST TECHNIQUE: Multidetector CT imaging of the abdomen and pelvis was performed using the standard protocol following bolus administration of intravenous contrast. RADIATION DOSE REDUCTION: This exam was performed according to the departmental dose-optimization program which includes automated exposure control, adjustment of the mA and/or kV according to patient size and/or use of iterative reconstruction technique. CONTRAST:  OMNIPAQUE IOHEXOL 300 MG/ML  SOLN  COMPARISON:  CT abdomen and pelvis 08/27/2020 and ultrasound earlier today FINDINGS: Lower chest: No acute abnormality. Hepatobiliary: No suspicious focal liver abnormality is seen. No gallstones, gallbladder wall thickening, or biliary dilatation. Pancreas: Unremarkable. No pancreatic ductal dilatation or surrounding inflammatory changes. Spleen: Normal in size without focal abnormality. Adrenals/Urinary Tract: Adrenal glands are unremarkable. Kidneys are normal, without renal calculi, suspicious focal lesion, or hydronephrosis. Bladder is unremarkable. Stomach/Bowel: Mild dilation of the appendix measuring approximately 9 mm in diameter with adjacent  periappendiceal fat stranding. No organized fluid collection or abscess. No free intraperitoneal air. Stomach is within normal limits. Normal caliber large and small bowel. Vascular/Lymphatic: No significant vascular findings are present. No enlarged abdominal or pelvic lymph nodes. Reproductive: The previous left ovarian cyst has been removed. No additional adnexal masses. Unremarkable uterus. Other: No free intraperitoneal fluid or air. Musculoskeletal: No acute or significant osseous findings. IMPRESSION: Acute uncomplicated appendicitis. Electronically Signed   By: Minerva Fester M.D.   On: 02/21/2022 23:47   US PELVIC COMPLETE W TRANSVAGINAL AND TORSION R/O  Result Date: 02/21/2022 CLINICAL DATA:  [id number]. Right lower quadrant pain last menstrual period 01/28/2022 EXAM: TRANSABDOMINAL AND TRANSVAGINAL ULTRASOUND OF PELVIS DOPPLER ULTRASOUND OF OVARIES TECHNIQUE: Both transabdominal and transvaginal ultrasound examinations of the pelvis were performed. Transabdominal technique was performed for global imaging of the pelvis including uterus, ovaries, adnexal regions, and pelvic cul-de-sac. It was necessary to proceed with endovaginal exam following the transabdominal exam to visualize the bilateral ovaries. Color and duplex Doppler ultrasound was utilized to  evaluate blood flow to the ovaries. COMPARISON:  Pelvic ultrasound 08/27/2020 FINDINGS: Uterus Measurements: 6.2 x 3.6 x 4.7 cm = volume: 55 mL. No fibroids or other mass visualized. Endometrium Thickness: 13 mm.  No focal abnormality visualized. Right ovary Measurements: 3 x 2.1 x 2.2 cm = volume: 7.1 mL. Normal appearance/no adnexal mass. Left ovary Measurements: 3.2 x 2.7 x 1.9 cm = volume: 8.4 mL. Normal appearance/no adnexal mass. Pulsed Doppler evaluation of both ovaries demonstrates normal low-resistance arterial and venous waveforms. Other findings No abnormal free fluid. IMPRESSION: Unremarkable pelvic ultrasound. Electronically Signed   By: Tish Frederickson M.D.   On: 02/21/2022 22:23   DG Chest Portable 1 View  Result Date: 02/21/2022 CLINICAL DATA:  Abdominal pain. EXAM: PORTABLE CHEST 1 VIEW COMPARISON:  August 27, 2020 FINDINGS: The heart size and mediastinal contours are within normal limits. Both lungs are clear. The visualized skeletal structures are unremarkable. IMPRESSION: No active disease. Electronically Signed   By: Aram Candela M.D.   On: 02/21/2022 21:54    Assessment     Patient Active Problem List   Diagnosis Date Noted   Acute appendicitis 02/22/2022   Left ovarian cyst 08/27/2020   Postoperative state 08/27/2020    Plan    Robotic assisted laparoscopic appendectomy.  The risks, benefits, complications, treatment options, and expected outcomes were discussed with the patient. The treatment of antibiotics alone was discussed giving a 20% chance that this could fail and surgery would be necessary.  Also discussed continuing to the operating room for Laparoscopic Appendectomy.  The possibilities of  bleeding, recurrent infection, perforation of viscus, finding a normal appendix, the need for additional procedures, failure to diagnose a condition, conversion to open procedure and creating a complication requiring transfusion or further operations were discussed. The  patient was given the opportunity to ask questions and have them answered.  Patient would like to proceed with robotic assisted laparoscopic Appendectomy and consent was obtained.     Latest Ref Rng & Units 02/21/2022    4:07 PM 08/28/2020    5:49 AM 08/27/2020   11:39 AM  CBC  WBC 4.0 - 10.5 K/uL 21.4  13.5  11.1   Hemoglobin 12.0 - 15.0 g/dL 10.2  72.5  36.6   Hematocrit 36.0 - 46.0 % 38.3  34.1  36.7   Platelets 150 - 400 K/uL 323  309  330      Face-to-face time spent with the patient and accompanying  care providers(if present) was 30 minutes, with more than 50% of the time spent counseling, educating, and coordinating care of the patient.    These notes generated with voice recognition software. I apologize for typographical errors.  Campbell Lerner M.D., FACS 02/22/2022, 12:00 PM

## 2022-02-22 NOTE — Progress Notes (Signed)
   02/22/22 2000  Assess: MEWS Score  BP (!) 93/55  MAP (mmHg) 65  Pulse Rate (!) 112  ECG Heart Rate (!) 112  Level of Consciousness Alert  SpO2 95 %  O2 Device Nasal Cannula  O2 Flow Rate (L/min) 3 L/min  Assess: MEWS Score  MEWS Temp 0  MEWS Systolic 1  MEWS Pulse 2  MEWS RR 1  MEWS LOC 0  MEWS Score 4  MEWS Score Color Red  Assess: if the MEWS score is Yellow or Red  Were vital signs taken at a resting state? Yes  Focused Assessment No change from prior assessment  Does the patient meet 2 or more of the SIRS criteria? No  MEWS guidelines implemented *See Row Information* Yes  Treat  MEWS Interventions Escalated (See documentation below)  Pain Scale 0-10  Pain Score 0  Take Vital Signs  Increase Vital Sign Frequency  Red: Q 1hr X 4 then Q 4hr X 4, if remains red, continue Q 4hrs  Escalate  MEWS: Escalate Red: discuss with charge nurse/RN and provider, consider discussing with RRT  Notify: Charge Nurse/RN  Name of Charge Nurse/RN Notified Raynelle Fanning, RN  Date Charge Nurse/RN Notified 02/22/22  Time Charge Nurse/RN Notified 2020  Notify: Provider  Provider Name/Title [place]  Date Provider Notified 02/22/22  Time Provider Notified 2030  Method of Notification Page  Notification Reason Other (Comment) (RED mews, low BP, tachy)  Provider response Other (Comment) (awaiting response)  Date of Provider Response 02/22/22  Time of Provider Response 2105  Notify: Rapid Response  Name of Rapid Response RN Notified Steward Drone  Date Rapid Response Notified 02/22/22  Time Rapid Response Notified 2030  Document  Patient Outcome Other (Comment) (BP remians low and pt tachy after bolus, MD aware, pt asymptomatic otherwise)  Progress note created (see row info) Yes  Assess: SIRS CRITERIA  SIRS Temperature  0  SIRS Pulse 1  SIRS Respirations  1  SIRS WBC 1  SIRS Score Sum  3

## 2022-02-22 NOTE — Progress Notes (Signed)
Pt was alert oriented and walked to the restroom. She was having some lower pressures and Dr Lorette Ang was called .Upon her arrival back in bed Dr Lorette Ang anesthesiologist came to the bedside and I told him that pressures and O2 were slightly low and he said she was hemodynamically stable and he felt she was a young healthy young lady that was coming out of surgery and felt she was perfectly stable.

## 2022-02-23 ENCOUNTER — Encounter: Payer: Self-pay | Admitting: Surgery

## 2022-02-23 DIAGNOSIS — E876 Hypokalemia: Secondary | ICD-10-CM | POA: Diagnosis not present

## 2022-02-23 DIAGNOSIS — K3580 Unspecified acute appendicitis: Secondary | ICD-10-CM | POA: Diagnosis present

## 2022-02-23 DIAGNOSIS — Z20822 Contact with and (suspected) exposure to covid-19: Secondary | ICD-10-CM | POA: Diagnosis present

## 2022-02-23 DIAGNOSIS — R1031 Right lower quadrant pain: Secondary | ICD-10-CM | POA: Diagnosis present

## 2022-02-23 LAB — BASIC METABOLIC PANEL
Anion gap: 5 (ref 5–15)
BUN: 6 mg/dL (ref 6–20)
CO2: 22 mmol/L (ref 22–32)
Calcium: 7.3 mg/dL — ABNORMAL LOW (ref 8.9–10.3)
Chloride: 115 mmol/L — ABNORMAL HIGH (ref 98–111)
Creatinine, Ser: 0.84 mg/dL (ref 0.44–1.00)
GFR, Estimated: >60 mL/min (ref >60)
Glucose, Bld: 117 mg/dL — ABNORMAL HIGH (ref 70–99)
Potassium: 3.4 mmol/L — ABNORMAL LOW (ref 3.5–5.1)
Sodium: 142 mmol/L (ref 135–145)

## 2022-02-23 LAB — CBC WITH DIFFERENTIAL/PLATELET
Abs Immature Granulocytes: 0.4 10*3/uL — ABNORMAL HIGH (ref 0.00–0.07)
Basophils Absolute: 0.1 10*3/uL (ref 0.0–0.1)
Basophils Relative: 0 %
Eosinophils Absolute: 0 10*3/uL (ref 0.0–0.5)
Eosinophils Relative: 0 %
HCT: 27.5 % — ABNORMAL LOW (ref 36.0–46.0)
Hemoglobin: 9.1 g/dL — ABNORMAL LOW (ref 12.0–15.0)
Immature Granulocytes: 2 %
Lymphocytes Relative: 1 %
Lymphs Abs: 0.4 10*3/uL — ABNORMAL LOW (ref 0.7–4.0)
MCH: 26.8 pg (ref 26.0–34.0)
MCHC: 33.1 g/dL (ref 30.0–36.0)
MCV: 80.9 fL (ref 80.0–100.0)
Monocytes Absolute: 0.4 10*3/uL (ref 0.1–1.0)
Monocytes Relative: 1 %
Neutro Abs: 24.9 10*3/uL — ABNORMAL HIGH (ref 1.7–7.7)
Neutrophils Relative %: 96 %
Platelets: 126 10*3/uL — ABNORMAL LOW (ref 150–400)
RBC: 3.4 MIL/uL — ABNORMAL LOW (ref 3.87–5.11)
RDW: 16.1 % — ABNORMAL HIGH (ref 11.5–15.5)
WBC: 26 10*3/uL — ABNORMAL HIGH (ref 4.0–10.5)
nRBC: 0 % (ref 0.0–0.2)

## 2022-02-23 LAB — CBC
HCT: 29.3 % — ABNORMAL LOW (ref 36.0–46.0)
Hemoglobin: 9.4 g/dL — ABNORMAL LOW (ref 12.0–15.0)
MCH: 26 pg (ref 26.0–34.0)
MCHC: 32.1 g/dL (ref 30.0–36.0)
MCV: 81.2 fL (ref 80.0–100.0)
Platelets: 147 10*3/uL — ABNORMAL LOW (ref 150–400)
RBC: 3.61 MIL/uL — ABNORMAL LOW (ref 3.87–5.11)
RDW: 16.4 % — ABNORMAL HIGH (ref 11.5–15.5)
WBC: 33.1 10*3/uL — ABNORMAL HIGH (ref 4.0–10.5)
nRBC: 0 % (ref 0.0–0.2)

## 2022-02-23 LAB — LACTIC ACID, PLASMA
Lactic Acid, Venous: 2.8 mmol/L (ref 0.5–1.9)
Lactic Acid, Venous: 4.1 mmol/L (ref 0.5–1.9)

## 2022-02-23 LAB — PROCALCITONIN: Procalcitonin: 55.12 ng/mL

## 2022-02-23 MED ORDER — POTASSIUM CHLORIDE CRYS ER 10 MEQ PO TBCR
10.0000 meq | EXTENDED_RELEASE_TABLET | Freq: Once | ORAL | Status: AC
  Administered 2022-02-23: 10 meq via ORAL
  Filled 2022-02-23: qty 1

## 2022-02-23 MED ORDER — SODIUM CHLORIDE 0.9 % IV SOLN: INTRAVENOUS | Status: DC | PRN

## 2022-02-23 MED ORDER — SODIUM CHLORIDE 0.9 % IV BOLUS
500.0000 mL | Freq: Once | INTRAVENOUS | Status: AC | PRN
  Administered 2022-02-23: 500 mL via INTRAVENOUS

## 2022-02-23 MED ORDER — IBUPROFEN 400 MG PO TABS
400.0000 mg | ORAL_TABLET | Freq: Four times a day (QID) | ORAL | Status: DC | PRN
Start: 2022-02-23 — End: 2022-02-25
  Administered 2022-02-23: 400 mg via ORAL
  Filled 2022-02-23: qty 1

## 2022-02-23 NOTE — Progress Notes (Signed)
[facility] SUR[facility]ICAL PRO[facility]ESS NOTE  Hospital Day(s): 0.   Post op day(s): 1 Day Post-Op.   Interval History: Patient seen and examined, she looks great, no acute events or new complaints overnight. Patient reports tolerating clear liquids. reports loose stools with every p.o. intake   Review of Systems:  Constitutional: denies fever, chills  Respiratory: denies any shortness of breath  Cardiovascular: denies chest pain or palpitations  Gastrointestinal: denies uncontrolled abdominal pain, N/V, reports loose stools with every p.o. intake diarrhea/and bowel function as per interval history Musculoskeletal: denies pain, decreased motor or sensation Integumentary: denies any other rashes or skin discolorations  Vital signs in last 24 hours: [min-max] current  Temp:  [97.7 F (36.5 C)-99.2 F (37.3 C)] 98.8 F (37.1 C) (08/13 0450) Pulse Rate:  [74-158] 97 (08/13 0450) Resp:  [15-33] 16 (08/13 0450) BP: (78-111)/(43-64) 105/64 (08/13 0450) SpO2:  [73 %-100 %] 97 % (08/13 0450)     Height: 5\' 2"  (157.5 cm) Weight: 63.5 kg BMI (Calculated): 25.6   Intake/Output last 2 shifts:  08/12 0701 - 08/13 0700 In: 6039.8 [I.V.:5340; IV Piggyback:699.8] Out: 1150 [Urine:1150]   Physical Exam:  Constitutional: alert, cooperative and no distress  Respiratory: breathing non-labored at rest  Cardiovascular: regular rate and sinus rhythm  Gastrointestinal: Incisions intact, peri-incisional tenderness otherwise soft, non-tender, and non-distended Integumentary: No erythema, ecchymosis or rash.  Labs:     Latest Ref Rng & Units 02/23/2022    4:44 AM 02/23/2022   12:12 AM 02/21/2022    4:07 PM  CBC  WBC 4.0 - 10.5 K/uL 33.1  26.0  21.4   Hemoglobin 12.0 - 15.0 g/dL 9.4  9.1  04/23/2022   Hematocrit 36.0 - 46.0 % 29.3  27.5  38.3   Platelets 150 - 400 K/uL 147  126  323       Latest Ref Rng & Units 02/23/2022    4:44 AM 02/21/2022    4:07 PM 08/27/2020   11:39 AM  CMP  Glucose 70  - 99 mg/dL 08/29/2020  916  945   BUN 6 - 20 mg/dL 6  10  7    Creatinine 0.44 - 1.00 mg/dL 038   8.82   Sodium 135 - 145 mmol/L 142  139  137   Potassium 3.5 - 5.1 mmol/L 3.4  3.5  3.6   Chloride 98 - 111 mmol/L 115  106  102   CO2 22 - 32 mmol/L 22  23  24    Calcium 8.9 - 10.3 mg/dL 7.3  9.3  9.6   Total Protein 6.5 - 8.1 g/dL  8.4  8.8   Total Bilirubin 0.3 - 1.2 mg/dL  0.9  0.5   Alkaline Phos 38 - 126 U/L  64  77   AST 15 - 41 U/L  25  29   ALT 0 - 44 U/L  20  31      Imaging studies: No new pertinent imaging studies   Assessment/Plan:  21 y.o. female with  1 Day Post-Op s/p robotic appendectomy for complicated appendicitis.   -Advance diet as tolerated.  -Repeat CBC, BMP in AM.  -Replenish potassium with oral KCl.  -Decrease IV fluids.  -Monitor for fever/abdominal exam.  All of the above findings and recommendations were discussed with the patient, and all of patient's questions were answered to their expressed satisfaction.  -- 3.49, M.D., [facility] 02/23/2022

## 2022-02-24 LAB — CBC
HCT: 26.3 % — ABNORMAL LOW (ref 36.0–46.0)
Hemoglobin: 8.7 g/dL — ABNORMAL LOW (ref 12.0–15.0)
MCH: 26.3 pg (ref 26.0–34.0)
MCHC: 33.1 g/dL (ref 30.0–36.0)
MCV: 79.5 fL — ABNORMAL LOW (ref 80.0–100.0)
Platelets: 115 10*3/uL — ABNORMAL LOW (ref 150–400)
RBC: 3.31 MIL/uL — ABNORMAL LOW (ref 3.87–5.11)
RDW: 16.5 % — ABNORMAL HIGH (ref 11.5–15.5)
WBC: 19.5 10*3/uL — ABNORMAL HIGH (ref 4.0–10.5)
nRBC: 0 % (ref 0.0–0.2)

## 2022-02-24 LAB — BASIC METABOLIC PANEL
Anion gap: 5 (ref 5–15)
BUN: 8 mg/dL (ref 6–20)
CO2: 21 mmol/L — ABNORMAL LOW (ref 22–32)
Calcium: 6.8 mg/dL — ABNORMAL LOW (ref 8.9–10.3)
Chloride: 112 mmol/L — ABNORMAL HIGH (ref 98–111)
Creatinine, Ser: 0.6 mg/dL (ref 0.44–1.00)
GFR, Estimated: >60 mL/min (ref >60)
Glucose, Bld: 80 mg/dL (ref 70–99)
Potassium: 2.4 mmol/L — CL (ref 3.5–5.1)
Sodium: 138 mmol/L (ref 135–145)

## 2022-02-24 LAB — MAGNESIUM: Magnesium: 1.6 mg/dL — ABNORMAL LOW (ref 1.7–2.4)

## 2022-02-24 MED ORDER — POTASSIUM CHLORIDE CRYS ER 20 MEQ PO TBCR
40.0000 meq | EXTENDED_RELEASE_TABLET | ORAL | Status: AC
  Administered 2022-02-24 (×2): 40 meq via ORAL
  Filled 2022-02-24 (×2): qty 2

## 2022-02-24 MED ORDER — LOPERAMIDE HCL 2 MG PO CAPS
2.0000 mg | ORAL_CAPSULE | Freq: Two times a day (BID) | ORAL | Status: DC
  Administered 2022-02-24 – 2022-02-25 (×3): 2 mg via ORAL
  Filled 2022-02-24 (×3): qty 1

## 2022-02-24 MED ORDER — POTASSIUM CHLORIDE CRYS ER 20 MEQ PO TBCR: 20.0000 meq | EXTENDED_RELEASE_TABLET | Freq: Two times a day (BID) | ORAL | Status: DC

## 2022-02-24 MED ORDER — POTASSIUM CHLORIDE CRYS ER 20 MEQ PO TBCR
20.0000 meq | EXTENDED_RELEASE_TABLET | Freq: Two times a day (BID) | ORAL | Status: DC
  Administered 2022-02-25: 20 meq via ORAL
  Filled 2022-02-24: qty 1

## 2022-02-24 NOTE — Progress Notes (Signed)
[facility] SUR[facility] PROGRESS NOTE  Hospital Day(s): 1.   Post op day(s): 2 Days Post-Op.   Interval History:  Patient seen and examined No acute events or new complaints overnight.  Patient reports she is doing well; tired Abdominal soreness; mild No fever, chills, emesis Leukocytosis improving; down to 19.5K (from 33.1K) Hgb to 8.7; suspect dilutional Renal function normal; sCr - 0.60; UO - unmeasured Significant hypokalemia to 2.4 She continues on Zosyn On regular diet; having bowel function   Vital signs in last 24 hours: [min-max] current  Temp:  [98.3 F (36.8 C)-99 F (37.2 C)] 98.3 F (36.8 C) (08/14 0452) Pulse Rate:  [86-99] 89 (08/14 0452) Resp:  [18] 18 (08/14 0452) BP: (95-105)/(55-64) 105/63 (08/14 0452) SpO2:  [96 %-100 %] 96 % (08/14 0452)     Height: 5\' 2"  (157.5 cm) Weight: 63.5 kg BMI (Calculated): 25.6   Intake/Output last 2 shifts:  08/13 0701 - 08/14 0700 In: 432.6 [I.V.:224.9; IV Piggyback:207.7] Out: -    Physical Exam:  Constitutional: alert, cooperative and no distress  Respiratory: breathing non-labored at rest  Cardiovascular: regular rate and sinus rhythm  Gastrointestinal: soft, non-tender, and non-distended, no rebound/guarding Integumentary: Laparoscopic incisions are CDI with dermabond; no erythema or drainage   Labs:     Latest Ref Rng & Units 02/24/2022    4:24 AM 02/23/2022    4:44 AM 02/23/2022   12:12 AM  CBC  WBC 4.0 - 10.5 K/uL 19.5  33.1  26.0   Hemoglobin 12.0 - 15.0 g/dL 8.7  9.4  9.1   Hematocrit 36.0 - 46.0 % 26.3  29.3  27.5   Platelets 150 - 400 K/uL 115  147  126       Latest Ref Rng & Units 02/24/2022    4:24 AM 02/23/2022    4:44 AM 02/21/2022    4:07 PM  CMP  Glucose 70 - 99 mg/dL 80  04/23/2022  283   BUN 6 - 20 mg/dL 8  6  10    Creatinine 0.44 - 1.00 mg/dL 151   7.61   Sodium 135 - 145 mmol/L 138  142  139   Potassium 3.5 - 5.1 mmol/L 2.4  3.4  3.5   Chloride 98 - 111 mmol/L 112  115  106    CO2 22 - 32 mmol/L 21  22  23    Calcium 8.9 - 10.3 mg/dL 6.8  7.3  9.3   Total Protein 6.5 - 8.1 g/dL   8.4   Total Bilirubin 0.3 - 1.2 mg/dL   0.9   Alkaline Phos 38 - 126 U/L   64   AST 15 - 41 U/L   25   ALT 0 - 44 U/L   20      Imaging studies: No new pertinent imaging studies   Assessment/Plan:  21 y.o. female with hypokalemia, otherwise doing wel 2 Days Post-Op s/p robotic assisted laparoscopic appendectomy for acute, hemorrhagic, appendicitis.   - Okay to continue regular diet   - Will need K+ repletion; added KDUR; will ask pharmacy to assist; check Mag  - Continue IV Abx (Zosyn); Day 3; will need PO for home   - Monitor abdominal examination; on-going bowel function - Monitor leukocytosis; improving - Pain control prn; antiemetics prn - Mobilization   - Discharge Planning; Doing well; leukocytosis improving. Main barrier is hypokalemia today; will replete. Anticipate DC home tomorrow (08/15)  All of the above findings and recommendations were discussed with the  patient, and the medical team, and all of patient's questions were answered to her expressed satisfaction.  -- Lynden Oxford, PA-C [facility] 02/24/2022, 7:17 AM M-F: 7am - 4pm

## 2022-02-24 NOTE — TOC Initial Note (Signed)
Transition of Care [facility]) - Initial/Assessment Note    Patient Details  Name: Kathleen Turner MRN: [id number] Date of Birth: 03/13/2001  Transition of Care [facility]) CM/SW Contact:    Chapman Fitch, RN Phone Number: 02/24/2022, 1:39 PM  Clinical Narrative:                     Transition of Care (TOC) Screening Note   Patient Details  Name: Kathleen Turner Date of Birth: Nov 02, 2000   Transition of Care [facility]) CM/SW Contact:    Chapman Fitch, RN Phone Number: 02/24/2022, 1:39 PM    Transition of Care Department [facility]) has reviewed patient and no TOC needs have been identified at this time. We will continue to monitor patient advancement through interdisciplinary progression rounds. If new patient transition needs arise, please place a TOC consult.       Patient Goals and CMS Choice        Expected Discharge Plan and Services                                                Prior Living Arrangements/Services                       Activities of Daily Living Home Assistive Devices/Equipment: None ADL Screening (condition at time of admission) Patient's cognitive ability adequate to safely complete daily activities?: Yes Is the patient deaf or have difficulty hearing?: No Does the patient have difficulty seeing, even when wearing glasses/contacts?: No Does the patient have difficulty concentrating, remembering, or making decisions?: No Patient able to express need for assistance with ADLs?: Yes Does the patient have difficulty dressing or bathing?: No Independently performs ADLs?: Yes (appropriate for developmental age) Does the patient have difficulty walking or climbing stairs?: No Weakness of Legs: None Weakness of Arms/Hands: None  Permission Sought/Granted                  Emotional Assessment              Admission diagnosis:  Acute appendicitis [K35.80] Acute appendicitis, uncomplicated [K35.80] Patient Active  Problem List   Diagnosis Date Noted   Acute appendicitis 02/22/2022   Left ovarian cyst 08/27/2020   Postoperative state 08/27/2020   PCP:  Patient, No Pcp Per Pharmacy:   [facility] [address] ([address]), [place] - [address] [place]) [place] [address] Phone: [phone] Fax: [phone]     Social Determinants of Health (SDOH) Interventions    Readmission Risk Interventions     No data to display

## 2022-02-25 LAB — BASIC METABOLIC PANEL
Anion gap: 5 (ref 5–15)
BUN: <5 mg/dL — ABNORMAL LOW (ref 6–20)
CO2: 24 mmol/L (ref 22–32)
Calcium: 7.6 mg/dL — ABNORMAL LOW (ref 8.9–10.3)
Chloride: 111 mmol/L (ref 98–111)
Creatinine, Ser: 0.59 mg/dL (ref 0.44–1.00)
GFR, Estimated: >60 mL/min (ref >60)
Glucose, Bld: 81 mg/dL (ref 70–99)
Potassium: 2.9 mmol/L — ABNORMAL LOW (ref 3.5–5.1)
Sodium: 140 mmol/L (ref 135–145)

## 2022-02-25 LAB — CBC
HCT: 28.8 % — ABNORMAL LOW (ref 36.0–46.0)
Hemoglobin: 9.5 g/dL — ABNORMAL LOW (ref 12.0–15.0)
MCH: 26 pg (ref 26.0–34.0)
MCHC: 33 g/dL (ref 30.0–36.0)
MCV: 78.9 fL — ABNORMAL LOW (ref 80.0–100.0)
Platelets: 146 10*3/uL — ABNORMAL LOW (ref 150–400)
RBC: 3.65 MIL/uL — ABNORMAL LOW (ref 3.87–5.11)
RDW: 16.1 % — ABNORMAL HIGH (ref 11.5–15.5)
WBC: 9.4 10*3/uL (ref 4.0–10.5)
nRBC: 0.2 % (ref 0.0–0.2)

## 2022-02-25 LAB — SURGICAL PATHOLOGY

## 2022-02-25 MED ORDER — AMOXICILLIN-POT CLAVULANATE 875-125 MG PO TABS
1.0000 | ORAL_TABLET | Freq: Two times a day (BID) | ORAL | 0 refills | Status: AC
Start: 2022-02-25 — End: 2022-03-04

## 2022-02-25 MED ORDER — IBUPROFEN 600 MG PO TABS: 600.0000 mg | ORAL_TABLET | Freq: Four times a day (QID) | ORAL | 0 refills | Status: DC | PRN

## 2022-02-25 MED ORDER — POTASSIUM CHLORIDE CRYS ER 20 MEQ PO TBCR: 20.0000 meq | EXTENDED_RELEASE_TABLET | Freq: Two times a day (BID) | ORAL | 0 refills | Status: DC

## 2022-02-25 MED ORDER — LOPERAMIDE HCL 2 MG PO CAPS: 2.0000 mg | ORAL_CAPSULE | Freq: Two times a day (BID) | ORAL | 0 refills | Status: DC

## 2022-02-25 NOTE — Progress Notes (Signed)
Mobility Specialist - Progress Note    02/25/22 0811  Mobility  Activity Ambulated independently in hallway  Level of Assistance Independent  Assistive Device None  Distance Ambulated (ft) 400 ft  Activity Response Tolerated well  $Mobility charge 1 Mobility    Pt completes all activities indep and voices no complaints. Denies pain. Returns to bed with needs in reach.  Clarisa Schools Mobility Specialist 02/25/22, 8:13 AM

## 2022-02-25 NOTE — Discharge Summary (Signed)
[facility] SURGICAL DISCHARGE SUMMARY  Patient ID: Kathleen Turner MRN: [id number] DOB/AGE: [birth year] 21 y.o.  Admit date: 02/21/2022 Discharge date: 02/25/2022  Discharge Diagnoses Patient Active Problem List   Diagnosis Date Noted   Acute appendicitis 02/22/2022    Consultants None  Procedures 02/22/2022 Robotic assisted laparoscopic appendectomy    HPI: Kathleen Turner is a 21 y.o. female with acute appendicitis   Hospital Course: Informed consent was obtained and documented, and patient underwent uneventful robotic assisted laparoscopic appendectomy (Dr Claudine Mouton, 02/22/2022).  Post-operatively, patient did stay secondary to leukocytosis, hypokalemia, and diarrhea. Tese improved. Leukocytosis resolved. Hypokalemia improved but not normalized at discharged. She will be sent home with K+ repletion and I will check this as outpatient. Advancement of patient's diet and ambulation were well-tolerated. The remainder of patient's hospital course was essentially unremarkable, and discharge planning was initiated accordingly with patient safely able to be discharged home with appropriate discharge instructions, antibiotics (Augmentin x7 days), pain control, and outpatient follow-up after all of her questions were answered to her expressed satisfaction.   Discharge Condition: Good    Physical Examination:  Constitutional: alert, cooperative and no distress  Respiratory: breathing non-labored at rest  Cardiovascular: regular rate and sinus rhythm  Gastrointestinal: soft, non-tender, and non-distended, no rebound/guarding Integumentary: Laparoscopic incisions are CDI with dermabond; no erythema or drainage   Allergies as of 02/25/2022   No Known Allergies      Medication List     TAKE these medications    amoxicillin-clavulanate 875-125 MG tablet Commonly known as: AUGMENTIN Take 1 tablet by mouth 2 (two) times daily for 7 days.   ibuprofen 600 MG  tablet Commonly known as: ADVIL Take 1 tablet (600 mg total) by mouth every 6 (six) hours as needed.   loperamide 2 MG capsule Commonly known as: IMODIUM Take 1 capsule (2 mg total) by mouth 2 (two) times daily.   potassium chloride SA 20 MEQ tablet Commonly known as: KLOR-CON M Take 1 tablet (20 mEq total) by mouth 2 (two) times daily for 14 days.          Follow-up Information     Donovan Kail, PA-C. Schedule an appointment as soon as possible for a visit in 2 week(s).   Specialty: Physician Assistant Why: s/p laparoscopic appendectomy; check potassium prior to appointment Contact information: [address] [place] [address] [phone]                  Time spent on discharge management including discussion of hospital course, clinical condition, outpatient instructions, prescriptions, and follow up with the patient and members of the medical team: >30 minutes  -- Lynden Oxford , PA-C [facility]  02/25/2022, 8:27 AM [phone] M-F: 7am - 4pm

## 2022-02-25 NOTE — Progress Notes (Signed)
Pt discharged per MD order. IV removed. Discharge instructions reviewed with pt. Pt verbalized understanding. Work note given to pt.

## 2022-02-25 NOTE — Discharge Instructions (Addendum)
In addition to included general post-operative instructions,  Diet: Resume home diet.   Activity: No heavy lifting >20 pounds (children, pets, laundry, garbage) or strenuous activity for 4 weeks, but light activity and walking are encouraged. Do not drive or drink alcohol if taking narcotic pain medications or having pain that might distract from driving.  Wound care: You may shower/get incision wet with soapy water and pat dry (do not rub incisions), but no baths or submerging incision underwater until follow-up.   Diarrhea: Continue imodium BID until diarrhea stops. Once bowel movements have returned to normal, you can stop Imodium  Potassium: Potassium improving. I did send you home with a prescription for potassium pills twice daily for 2 weeks. I also placed order for you to have your potassium level checked in about 1 week. This can be done at the hospital.   Medications:For mild to moderate pain: acetaminophen (Tylenol) or ibuprofen/naproxen (if no kidney disease). Combining Tylenol with alcohol can substantially increase your risk of causing liver disease. Narcotic pain medications, if prescribed, can be used for severe pain, though may cause nausea, constipation, and drowsiness. Do not combine Tylenol and Percocet (or similar) within a 6 hour period as Percocet (and similar) contain(s) Tylenol. If you do not need the narcotic pain medication, you do not need to fill the prescription.  Call office [phone] / [phone]) at any time if any questions, worsening pain, fevers/chills, bleeding, drainage from incision site, or other concerns.

## 2022-02-26 LAB — CULTURE, BLOOD (ROUTINE X 2)
Culture: NO GROWTH
Culture: NO GROWTH
Special Requests: ADEQUATE
Special Requests: ADEQUATE

## 2022-03-10 ENCOUNTER — Other Ambulatory Visit
Admission: RE | Admit: 2022-03-10 | Discharge: 2022-03-10 | Disposition: A | Discharge status: Home / Self Care | Payer: Medicaid Other | Attending: Physician Assistant | Admitting: Physician Assistant

## 2022-03-10 ENCOUNTER — Other Ambulatory Visit: Payer: Self-pay

## 2022-03-10 DIAGNOSIS — E876 Hypokalemia: Secondary | ICD-10-CM | POA: Diagnosis present

## 2022-03-10 DIAGNOSIS — Z9889 Other specified postprocedural states: Secondary | ICD-10-CM

## 2022-03-10 LAB — BASIC METABOLIC PANEL
Anion gap: 8 (ref 5–15)
BUN: 13 mg/dL (ref 6–20)
CO2: 24 mmol/L (ref 22–32)
Calcium: 9.5 mg/dL (ref 8.9–10.3)
Chloride: 106 mmol/L (ref 98–111)
Creatinine, Ser: 0.61 mg/dL (ref 0.44–1.00)
GFR, Estimated: >60 mL/min (ref >60)
Glucose, Bld: 94 mg/dL (ref 70–99)
Potassium: 4.4 mmol/L (ref 3.5–5.1)
Sodium: 138 mmol/L (ref 135–145)

## 2022-03-11 ENCOUNTER — Encounter: Payer: Medicaid Other | Admitting: Physician Assistant

## 2022-03-13 ENCOUNTER — Telehealth: Payer: Self-pay | Admitting: *Deleted

## 2022-03-13 NOTE — Telephone Encounter (Signed)
Faxed FMLA to [employer] at [phone] 

## 2022-03-18 ENCOUNTER — Ambulatory Visit (INDEPENDENT_AMBULATORY_CARE_PROVIDER_SITE_OTHER): Payer: Medicaid Other | Admitting: Physician Assistant

## 2022-03-18 ENCOUNTER — Encounter: Payer: Self-pay | Admitting: Physician Assistant

## 2022-03-18 VITALS — BP 126/82 | HR 118 | Temp 99.1°F | Ht 63.0 in | Wt 138.2 lb

## 2022-03-18 DIAGNOSIS — K3580 Unspecified acute appendicitis: Secondary | ICD-10-CM

## 2022-03-18 DIAGNOSIS — K353 Acute appendicitis with localized peritonitis, without perforation or gangrene: Secondary | ICD-10-CM

## 2022-03-18 DIAGNOSIS — Z09 Encounter for follow-up examination after completed treatment for conditions other than malignant neoplasm: Secondary | ICD-10-CM

## 2022-03-18 NOTE — Patient Instructions (Signed)
If you have any concerns or questions, please feel free to call our office. Follow up as needed.    GENERAL POST-OPERATIVE PATIENT INSTRUCTIONS   WOUND CARE INSTRUCTIONS:  Keep a dry clean dressing on the wound if there is drainage. The initial bandage may be removed after 24 hours.  Once the wound has quit draining you may leave it open to air.  If clothing rubs against the wound or causes irritation and the wound is not draining you may cover it with a dry dressing during the daytime.  Try to keep the wound dry and avoid ointments on the wound unless directed to do so.  If the wound becomes bright red and painful or starts to drain infected material that is not clear, please contact your physician immediately.  If the wound is mildly pink and has a thick firm ridge underneath it, this is normal, and is referred to as a healing ridge.  This will resolve over the next 4-6 weeks.  BATHING: You may shower if you have been informed of this by your surgeon. However, Please do not submerge in a tub, hot tub, or pool until incisions are completely sealed or have been told by your surgeon that you may do so.  DIET:  You may eat any foods that you can tolerate.  It is a good idea to eat a high fiber diet and take in plenty of fluids to prevent constipation.  If you do become constipated you may want to take a mild laxative or take ducolax tablets on a daily basis until your bowel habits are regular.  Constipation can be very uncomfortable, along with straining, after recent surgery.  ACTIVITY:  You are encouraged to cough and deep breath or use your incentive spirometer if you were given one, every 15-30 minutes when awake.  This will help prevent respiratory complications and low grade fevers post-operatively if you had a general anesthetic.  You may want to hug a pillow when coughing and sneezing to add additional support to the surgical area, if you had abdominal or chest surgery, which will decrease pain  during these times.  You are encouraged to walk and engage in light activity for the next two weeks.  You should not lift more than 20 pounds for 6 weeks total after surgery as it could put you at increased risk for complications.  Twenty pounds is roughly equivalent to a plastic bag of groceries. At that time- Listen to your body when lifting, if you have pain when lifting, stop and then try again in a few days. Soreness after doing exercises or activities of daily living is normal as you get back in to your normal routine.  MEDICATIONS:  Try to take narcotic medications and anti-inflammatory medications, such as tylenol, ibuprofen, naprosyn, etc., with food.  This will minimize stomach upset from the medication.  Should you develop nausea and vomiting from the pain medication, or develop a rash, please discontinue the medication and contact your physician.  You should not drive, make important decisions, or operate machinery when taking narcotic pain medication.  SUNBLOCK Use sun block to incision area over the next year if this area will be exposed to sun. This helps decrease scarring and will allow you avoid a permanent darkened area over your incision.  QUESTIONS:  Please feel free to call our office if you have any questions, and we will be glad to assist you. [phone]   

## 2022-03-18 NOTE — Progress Notes (Addendum)
[facility] POST-OP OFFICE VISIT  03/18/2022  HPI: [name] is a 21 y.o. female 26 days s/p robotic assisted laparoscopic appendectomy for acute hemorrhagic appendicitis with Dr Claudine Mouton   She is doing well Intermittent soreness; using ibuprofen No fever, chills, nausea, emesis, or bowel changes Tolerating PO with issues No issues with incisions No other complaints   Of note, repeat potassium level on 08/28 was normal  Vital signs: BP 126/82   Pulse (!) 118   Temp 99.1 F (37.3 C) (Oral)   Ht 5\' 3"  (1.6 m)   Wt 138 lb 3.2 oz (62.7 kg)   SpO2 98%   BMI 24.48 kg/m    Physical Exam: Constitutional: Well appearing female, NAD Abdomen: Soft, non-tender, non-distended, no rebound/guarding Skin: Laparoscopic incisions are healing well, no erythema or drainage   Assessment/Plan: This is a 21 y.o. female 26 days s/p robotic assisted laparoscopic appendectomy for acute hemorrhagic appendicitis with Dr 12    - Pain control prn  - Reviewed wound care recommendation  - Reviewed lifting restrictions; she has completed these   - Reviewed surgical pathology; Appendicitis  - She can follow up on as needed basis; She understands to call with questions/concerns  -- Claudine Mouton, PA-C [facility] 03/18/2022, 3:06 PM M-F: 7am - 4pm

## 2023-03-08 ENCOUNTER — Emergency Department: Payer: Medicaid Other

## 2023-03-08 ENCOUNTER — Emergency Department: Payer: Self-pay

## 2023-03-08 ENCOUNTER — Other Ambulatory Visit: Payer: Self-pay

## 2023-03-08 ENCOUNTER — Encounter: Payer: Self-pay | Admitting: Emergency Medicine

## 2023-03-08 ENCOUNTER — Emergency Department
Admission: EM | Admit: 2023-03-08 | Discharge: 2023-03-08 | Disposition: A | Discharge status: Home / Self Care | Payer: Self-pay | Source: Home / Self Care | Attending: Emergency Medicine | Admitting: Emergency Medicine

## 2023-03-08 DIAGNOSIS — F10929 Alcohol use, unspecified with intoxication, unspecified: Secondary | ICD-10-CM

## 2023-03-08 DIAGNOSIS — R4182 Altered mental status, unspecified: Secondary | ICD-10-CM

## 2023-03-08 DIAGNOSIS — F10129 Alcohol abuse with intoxication, unspecified: Secondary | ICD-10-CM | POA: Insufficient documentation

## 2023-03-08 DIAGNOSIS — Y908 Blood alcohol level of 240 mg/100 ml or more: Secondary | ICD-10-CM | POA: Diagnosis not present

## 2023-03-08 LAB — CBC WITH DIFFERENTIAL/PLATELET
Abs Immature Granulocytes: 0.03 10*3/uL (ref 0.00–0.07)
Basophils Absolute: 0 10*3/uL (ref 0.0–0.1)
Basophils Relative: 0 %
Eosinophils Absolute: 0.1 10*3/uL (ref 0.0–0.5)
Eosinophils Relative: 1 %
HCT: 37.6 % (ref 36.0–46.0)
Hemoglobin: 12.2 g/dL (ref 12.0–15.0)
Immature Granulocytes: 0 %
Lymphocytes Relative: 22 %
Lymphs Abs: 2 10*3/uL (ref 0.7–4.0)
MCH: 26.9 pg (ref 26.0–34.0)
MCHC: 32.4 g/dL (ref 30.0–36.0)
MCV: 82.8 fL (ref 80.0–100.0)
Monocytes Absolute: 0.4 10*3/uL (ref 0.1–1.0)
Monocytes Relative: 4 %
Neutro Abs: 6.4 10*3/uL (ref 1.7–7.7)
Neutrophils Relative %: 73 %
Platelets: 276 10*3/uL (ref 150–400)
RBC: 4.54 MIL/uL (ref 3.87–5.11)
RDW: 14.3 % (ref 11.5–15.5)
WBC: 9 10*3/uL (ref 4.0–10.5)
nRBC: 0 % (ref 0.0–0.2)

## 2023-03-08 LAB — COMPREHENSIVE METABOLIC PANEL
ALT: 34 U/L (ref 0–44)
AST: 35 U/L (ref 15–41)
Albumin: 3.9 g/dL (ref 3.5–5.0)
Alkaline Phosphatase: 57 U/L (ref 38–126)
Anion gap: 7 (ref 5–15)
BUN: 11 mg/dL (ref 6–20)
CO2: 20 mmol/L — ABNORMAL LOW (ref 22–32)
Calcium: 8.3 mg/dL — ABNORMAL LOW (ref 8.9–10.3)
Chloride: 112 mmol/L — ABNORMAL HIGH (ref 98–111)
Creatinine, Ser: 0.61 mg/dL (ref 0.44–1.00)
GFR, Estimated: >60 mL/min (ref >60)
Glucose, Bld: 92 mg/dL (ref 70–99)
Potassium: 3.4 mmol/L — ABNORMAL LOW (ref 3.5–5.1)
Sodium: 139 mmol/L (ref 135–145)
Total Bilirubin: 0.3 mg/dL (ref 0.3–1.2)
Total Protein: 7.4 g/dL (ref 6.5–8.1)

## 2023-03-08 LAB — URINALYSIS, ROUTINE W REFLEX MICROSCOPIC
Bilirubin Urine: NEGATIVE
Glucose, UA: NEGATIVE mg/dL
Hgb urine dipstick: NEGATIVE
Ketones, ur: NEGATIVE mg/dL
Leukocytes,Ua: NEGATIVE
Nitrite: NEGATIVE
Protein, ur: NEGATIVE mg/dL
Specific Gravity, Urine: 1.005 (ref 1.005–1.030)
pH: 6 (ref 5.0–8.0)

## 2023-03-08 LAB — HCG, QUANTITATIVE, PREGNANCY: hCG, Beta Chain, Quant, S: <1 m[IU]/mL (ref <5)

## 2023-03-08 LAB — URINE DRUG SCREEN, QUALITATIVE (ARMC ONLY)
Amphetamines, Ur Screen: NOT DETECTED
Barbiturates, Ur Screen: NOT DETECTED
Benzodiazepine, Ur Scrn: NOT DETECTED
Cannabinoid 50 Ng, Ur ~~LOC~~: POSITIVE — AB
Cocaine Metabolite,Ur ~~LOC~~: NOT DETECTED
MDMA (Ecstasy)Ur Screen: NOT DETECTED
Methadone Scn, Ur: NOT DETECTED
Opiate, Ur Screen: NOT DETECTED
Phencyclidine (PCP) Ur S: NOT DETECTED
Tricyclic, Ur Screen: NOT DETECTED

## 2023-03-08 LAB — MAGNESIUM: Magnesium: 2.2 mg/dL (ref 1.7–2.4)

## 2023-03-08 LAB — ETHANOL: Alcohol, Ethyl (B): 294 mg/dL — ABNORMAL HIGH (ref <10)

## 2023-03-08 LAB — SALICYLATE LEVEL: Salicylate Lvl: <7 mg/dL — ABNORMAL LOW (ref 7.0–30.0)

## 2023-03-08 LAB — ACETAMINOPHEN LEVEL: Acetaminophen (Tylenol), Serum: <10 ug/mL — ABNORMAL LOW (ref 10–30)

## 2023-03-08 MED ORDER — LACTATED RINGERS IV BOLUS
1000.0000 mL | Freq: Once | INTRAVENOUS | Status: AC
  Administered 2023-03-08: 1000 mL via INTRAVENOUS

## 2023-03-08 MED ORDER — LACTATED RINGERS IV BOLUS: 1000.0000 mL | Freq: Once | INTRAVENOUS | Status: DC

## 2023-03-08 MED ORDER — LACTATED RINGERS IV BOLUS
2000.0000 mL | Freq: Once | INTRAVENOUS | Status: AC
  Administered 2023-03-08: 2000 mL via INTRAVENOUS

## 2023-03-08 MED ORDER — ONDANSETRON HCL 4 MG/2ML IJ SOLN
4.0000 mg | Freq: Once | INTRAMUSCULAR | Status: AC
  Administered 2023-03-08: 4 mg via INTRAVENOUS
  Filled 2023-03-08: qty 2

## 2023-03-08 NOTE — ED Notes (Signed)
Spoke with mother of pt who is on way to pick up pt.

## 2023-03-08 NOTE — ED Notes (Signed)
Tried to call numbers listed in other chart (marked for merge). Left message on one number, other was no answer.

## 2023-03-08 NOTE — Discharge Instructions (Signed)
Return to the ER for new or worsening symptoms. °
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
## 2023-03-08 NOTE — ED Notes (Signed)
Pt now awake, walked out in to hallway requesting phone to call for ride home. Pt was here for AMS and alcohol intoxication. Pt walks with steady gait. Primary RN and EDP informed.

## 2023-03-08 NOTE — ED Notes (Signed)
BP systolic lower than 90 w/MAP below 65. Fluids on IV pump. BP checked on both arms. MD notified.

## 2023-03-08 NOTE — ED Notes (Signed)
ED Provider at bedside. 
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
## 2023-03-08 NOTE — ED Notes (Addendum)
ED Provider at bedside for reassessment. Per MD no other medications at this time. Fluids changed from pump to pressure bag.

## 2023-03-08 NOTE — ED Notes (Addendum)
RN notified provider of newest bps.

## 2023-03-09 ENCOUNTER — Encounter: Payer: Self-pay | Admitting: Physician Assistant

## 2023-06-22 ENCOUNTER — Other Ambulatory Visit: Payer: Self-pay | Admitting: Family Medicine

## 2023-06-22 DIAGNOSIS — Z3201 Encounter for pregnancy test, result positive: Secondary | ICD-10-CM

## 2023-07-15 NOTE — L&D Delivery Note (Signed)
 Delivery Note  Kathleen Turner is a G1P0 at [redacted]w[redacted]d, Patient's last menstrual period was 05/03/2023 (approximate)., consistent with US  at [redacted]w[redacted]d. Estimated Date of Delivery: 02/04/24 by 12 week US  given unsure LMP.  First Stage: Labor onset: 0300 Augmentation: None Analgesia /Anesthesia intrapartum: None SROM at 0340 GBS: positive IP Antibiotics: penicillin  x 1 dose  GBS status was initially noted as negative due to a negative GBS screen at 36 weeks. A positive GBS bacteruria in the first trimester was found on further chart review. Penicillin  ordered for GBS prophylaxis but only one dose of antibiotics was given prior to delivery.   Second Stage: Complete dilation at 0852 Onset of pushing at 0852 FHR second stage 135 bpm with moderate variability, variable decels with pushing   Kathleen Turner presented to L&D with SROM and active labor. She was expectantly managed. She progressed quickly to C/C/+2 with a strong urge to push.  She pushed effectively over approximately 20 minutes for a spontaneous vaginal birth.  Delivery of a viable baby girl on 01/28/2024 at 0915 by CNM Delivery of fetal head in OA position with restitution to LOT. Loose nuchal cord x 1, delivered through;  Anterior then posterior shoulders delivered easily with gentle downward traction. Right compound hand easily reduced. Baby placed on mom's chest, and attended to by baby RN Cord double clamped after cessation of pulsation, cut by grandmother of baby.   Cord blood sample collection: Yes O POS  Third Stage: Oxytocin  bolus started after delivery of infant for hemorrhage prophylaxis  Placenta delivered via Keren mechanism intact with 3 VC @ [id number] Placenta disposition: discarded Uterine tone firm / bleeding moderate   Laceration: 2nd degree perineal  Anesthesia for repair: 1% Lidocaine  for local  Suture: 2.0 chromic CT Est. Blood Loss (mL): 400 ml   Complications: None  Mom to postpartum.  Baby to Couplet care / Skin to  Skin.  Newborn: Information for the patient's newborn:  Zender, Girl Gerald [[id number]]  Live born female Seline Birth Weight: 5 lb 13.5 oz (2650 g) APGAR: 9, 9  Newborn Delivery   Birth date/time: 01/28/2024 09:15:00 Delivery type: Vaginal, Spontaneous    Feeding planned: breast feeding  ---------- Therisa Pillow, CNM Certified Nurse Midwife [place] [facility]

## 2023-07-27 LAB — OB RESULTS CONSOLE GBS: GBS: POSITIVE

## 2023-07-27 LAB — OB RESULTS CONSOLE HEPATITIS B SURFACE ANTIGEN: Hepatitis B Surface Ag: NEGATIVE

## 2023-07-27 LAB — OB RESULTS CONSOLE VARICELLA ZOSTER ANTIBODY, IGG: Varicella: NON-IMMUNE/NOT IMMUNE

## 2023-07-27 LAB — OB RESULTS CONSOLE GC/CHLAMYDIA
Chlamydia: NEGATIVE
Neisseria Gonorrhea: NEGATIVE

## 2023-07-27 LAB — OB RESULTS CONSOLE RUBELLA ANTIBODY, IGM: Rubella: IMMUNE

## 2023-07-27 LAB — OB RESULTS CONSOLE HIV ANTIBODY (ROUTINE TESTING): HIV: NONREACTIVE

## 2023-07-27 LAB — OB RESULTS CONSOLE RPR: RPR: NONREACTIVE

## 2023-07-28 ENCOUNTER — Ambulatory Visit
Admission: RE | Admit: 2023-07-28 | Discharge: 2023-07-28 | Disposition: A | Discharge status: Home / Self Care | Payer: Medicaid Other | Source: Ambulatory Visit | Attending: Family Medicine | Admitting: Family Medicine

## 2023-07-28 ENCOUNTER — Other Ambulatory Visit: Payer: Self-pay | Admitting: Family Medicine

## 2023-07-28 DIAGNOSIS — Z3201 Encounter for pregnancy test, result positive: Secondary | ICD-10-CM

## 2023-08-24 ENCOUNTER — Other Ambulatory Visit: Payer: Self-pay | Admitting: Family Medicine

## 2023-08-24 DIAGNOSIS — Z3402 Encounter for supervision of normal first pregnancy, second trimester: Secondary | ICD-10-CM

## 2023-09-17 ENCOUNTER — Ambulatory Visit
Admission: RE | Admit: 2023-09-17 | Discharge: 2023-09-17 | Disposition: A | Discharge status: Home / Self Care | Payer: Medicaid Other | Source: Ambulatory Visit | Attending: Family Medicine | Admitting: Family Medicine

## 2023-09-17 DIAGNOSIS — Z3689 Encounter for other specified antenatal screening: Secondary | ICD-10-CM | POA: Diagnosis not present

## 2023-09-17 DIAGNOSIS — Z3A19 19 weeks gestation of pregnancy: Secondary | ICD-10-CM | POA: Insufficient documentation

## 2023-09-17 DIAGNOSIS — Z3402 Encounter for supervision of normal first pregnancy, second trimester: Secondary | ICD-10-CM | POA: Diagnosis present

## 2024-01-28 ENCOUNTER — Other Ambulatory Visit: Payer: Self-pay

## 2024-01-28 ENCOUNTER — Inpatient Hospital Stay
Admission: EM | Admit: 2024-01-28 | Discharge: 2024-01-30 | DRG: 806 | Disposition: A | Discharge status: Home / Self Care | Attending: Obstetrics | Admitting: Obstetrics

## 2024-01-28 DIAGNOSIS — Z3A39 39 weeks gestation of pregnancy: Secondary | ICD-10-CM | POA: Diagnosis not present

## 2024-01-28 DIAGNOSIS — Z30017 Encounter for initial prescription of implantable subdermal contraceptive: Secondary | ICD-10-CM

## 2024-01-28 DIAGNOSIS — O99824 Streptococcus B carrier state complicating childbirth: Secondary | ICD-10-CM | POA: Diagnosis present

## 2024-01-28 DIAGNOSIS — O26893 Other specified pregnancy related conditions, third trimester: Secondary | ICD-10-CM | POA: Diagnosis present

## 2024-01-28 DIAGNOSIS — D62 Acute posthemorrhagic anemia: Secondary | ICD-10-CM | POA: Diagnosis not present

## 2024-01-28 DIAGNOSIS — O4290 Premature rupture of membranes, unspecified as to length of time between rupture and onset of labor, unspecified weeks of gestation: Principal | ICD-10-CM | POA: Diagnosis present

## 2024-01-28 DIAGNOSIS — Z2839 Other underimmunization status: Secondary | ICD-10-CM

## 2024-01-28 DIAGNOSIS — B951 Streptococcus, group B, as the cause of diseases classified elsewhere: Secondary | ICD-10-CM | POA: Diagnosis present

## 2024-01-28 DIAGNOSIS — O09893 Supervision of other high risk pregnancies, third trimester: Secondary | ICD-10-CM

## 2024-01-28 DIAGNOSIS — O9081 Anemia of the puerperium: Secondary | ICD-10-CM | POA: Diagnosis not present

## 2024-01-28 DIAGNOSIS — F4322 Adjustment disorder with anxiety: Secondary | ICD-10-CM | POA: Diagnosis present

## 2024-01-28 LAB — CBC
HCT: 34.7 % — ABNORMAL LOW (ref 36.0–46.0)
Hemoglobin: 11.3 g/dL — ABNORMAL LOW (ref 12.0–15.0)
MCH: 26 pg (ref 26.0–34.0)
MCHC: 32.6 g/dL (ref 30.0–36.0)
MCV: 80 fL (ref 80.0–100.0)
Platelets: 314 K/uL (ref 150–400)
RBC: 4.34 MIL/uL (ref 3.87–5.11)
RDW: 16.8 % — ABNORMAL HIGH (ref 11.5–15.5)
WBC: 13.9 K/uL — ABNORMAL HIGH (ref 4.0–10.5)
nRBC: 0 % (ref 0.0–0.2)

## 2024-01-28 LAB — RPR: RPR Ser Ql: NONREACTIVE

## 2024-01-28 LAB — TYPE AND SCREEN
ABO/RH(D): O POS
Antibody Screen: NEGATIVE

## 2024-01-28 MED ORDER — OXYTOCIN BOLUS FROM INFUSION: 333.0000 mL | Freq: Once | INTRAVENOUS | Status: AC

## 2024-01-28 MED ORDER — DIPHENHYDRAMINE HCL 25 MG PO CAPS: 25.0000 mg | ORAL_CAPSULE | Freq: Four times a day (QID) | ORAL | Status: DC | PRN

## 2024-01-28 MED ORDER — ONDANSETRON HCL 4 MG/2ML IJ SOLN: 4.0000 mg | INTRAMUSCULAR | Status: DC | PRN

## 2024-01-28 MED ORDER — SIMETHICONE 80 MG PO CHEW: 80.0000 mg | CHEWABLE_TABLET | ORAL | Status: DC | PRN

## 2024-01-28 MED ORDER — PENICILLIN G POT IN DEXTROSE 60000 UNIT/ML IV SOLN
3.0000 10*6.[IU] | INTRAVENOUS | Status: DC
  Filled 2024-01-28 (×3): qty 50

## 2024-01-28 MED ORDER — SODIUM CHLORIDE 0.9 % IV SOLN
5.0000 10*6.[IU] | Freq: Once | INTRAVENOUS | Status: AC
  Administered 2024-01-28: 5 10*6.[IU] via INTRAVENOUS
  Filled 2024-01-28: qty 5

## 2024-01-28 MED ORDER — FERROUS SULFATE 325 (65 FE) MG PO TABS
325.0000 mg | ORAL_TABLET | Freq: Two times a day (BID) | ORAL | Status: DC
  Administered 2024-01-28 – 2024-01-30 (×3): 325 mg via ORAL
  Filled 2024-01-28 (×3): qty 1

## 2024-01-28 MED ORDER — SENNOSIDES-DOCUSATE SODIUM 8.6-50 MG PO TABS
2.0000 | ORAL_TABLET | Freq: Every day | ORAL | Status: DC
  Administered 2024-01-29 – 2024-01-30 (×2): 2 via ORAL
  Filled 2024-01-28 (×2): qty 2

## 2024-01-28 MED ORDER — DIBUCAINE (PERIANAL) 1 % EX OINT: 1.0000 | TOPICAL_OINTMENT | CUTANEOUS | Status: DC | PRN

## 2024-01-28 MED ORDER — VARICELLA VIRUS VACCINE LIVE 1350 PFU/0.5ML IJ SUSR
0.5000 mL | INTRAMUSCULAR | Status: AC | PRN
  Administered 2024-01-30: 0.5 mL via SUBCUTANEOUS
  Filled 2024-01-28 (×2): qty 0.5

## 2024-01-28 MED ORDER — LIDOCAINE HCL (PF) 1 % IJ SOLN
30.0000 mL | INTRAMUSCULAR | Status: AC | PRN
  Administered 2024-01-28: 30 mL via SUBCUTANEOUS

## 2024-01-28 MED ORDER — LACTATED RINGERS IV SOLN: INTRAVENOUS | Status: DC

## 2024-01-28 MED ORDER — BENZOCAINE-MENTHOL 20-0.5 % EX AERO
1.0000 | INHALATION_SPRAY | CUTANEOUS | Status: DC | PRN
  Administered 2024-01-28: 1 via TOPICAL
  Filled 2024-01-28: qty 56

## 2024-01-28 MED ORDER — ACETAMINOPHEN 500 MG PO TABS
1000.0000 mg | ORAL_TABLET | Freq: Four times a day (QID) | ORAL | Status: DC
  Administered 2024-01-28 – 2024-01-30 (×5): 1000 mg via ORAL
  Filled 2024-01-28 (×6): qty 2

## 2024-01-28 MED ORDER — SOD CITRATE-CITRIC ACID 500-334 MG/5ML PO SOLN: 30.0000 mL | ORAL | Status: DC | PRN

## 2024-01-28 MED ORDER — LACTATED RINGERS IV SOLN: 500.0000 mL | INTRAVENOUS | Status: DC | PRN

## 2024-01-28 MED ORDER — ZOLPIDEM TARTRATE 5 MG PO TABS: 5.0000 mg | ORAL_TABLET | Freq: Every evening | ORAL | Status: DC | PRN

## 2024-01-28 MED ORDER — ONDANSETRON HCL 4 MG/2ML IJ SOLN: 4.0000 mg | Freq: Four times a day (QID) | INTRAMUSCULAR | Status: DC | PRN

## 2024-01-28 MED ORDER — OXYTOCIN-SODIUM CHLORIDE 30-0.9 UT/500ML-% IV SOLN
INTRAVENOUS | Status: AC
Start: 2024-01-28 — End: 2024-01-28
  Administered 2024-01-28: 333 mL via INTRAVENOUS
  Filled 2024-01-28: qty 500

## 2024-01-28 MED ORDER — COCONUT OIL OIL: 1.0000 | TOPICAL_OIL | Status: DC | PRN

## 2024-01-28 MED ORDER — ACETAMINOPHEN 325 MG PO TABS: 650.0000 mg | ORAL_TABLET | ORAL | Status: DC | PRN

## 2024-01-28 MED ORDER — PRENATAL MULTIVITAMIN CH
1.0000 | ORAL_TABLET | Freq: Every day | ORAL | Status: DC
  Administered 2024-01-28 – 2024-01-29 (×2): 1 via ORAL
  Filled 2024-01-28 (×2): qty 1

## 2024-01-28 MED ORDER — OXYTOCIN-SODIUM CHLORIDE 30-0.9 UT/500ML-% IV SOLN: 2.5000 [IU]/h | INTRAVENOUS | Status: DC

## 2024-01-28 MED ORDER — WITCH HAZEL-GLYCERIN EX PADS
1.0000 | MEDICATED_PAD | CUTANEOUS | Status: DC | PRN
  Administered 2024-01-28: 1 via TOPICAL
  Filled 2024-01-28 (×2): qty 100

## 2024-01-28 MED ORDER — IBUPROFEN 600 MG PO TABS
600.0000 mg | ORAL_TABLET | Freq: Four times a day (QID) | ORAL | Status: DC
  Administered 2024-01-28 – 2024-01-30 (×6): 600 mg via ORAL
  Filled 2024-01-28 (×7): qty 1

## 2024-01-28 MED ORDER — FENTANYL CITRATE (PF) 100 MCG/2ML IJ SOLN
50.0000 ug | INTRAMUSCULAR | Status: DC | PRN
  Administered 2024-01-28: 100 ug via INTRAVENOUS
  Filled 2024-01-28: qty 2

## 2024-01-28 MED ORDER — ONDANSETRON HCL 4 MG PO TABS: 4.0000 mg | ORAL_TABLET | ORAL | Status: DC | PRN

## 2024-01-28 NOTE — Discharge Instructions (Signed)
 Vaginal Delivery, Care After Refer to this sheet in the next few weeks. These discharge instructions provide you with information on caring for yourself after delivery. Your caregiver may also give you specific instructions. Your treatment has been planned according to the most current medical practices available, but problems sometimes occur. Call your caregiver if you have any problems or questions after you go home. HOME CARE INSTRUCTIONS Take over-the-counter or prescription medicines only as directed by your caregiver or pharmacist. Do not drink alcohol, especially if you are breastfeeding or taking medicine to relieve pain. Do not smoke tobacco. Continue to use good perineal care. Good perineal care includes: Wiping your perineum from back to front Keeping your perineum clean. You can do sitz baths twice a day, to help keep this area clean Do not use tampons, douche or have sex until your caregiver says it is okay. Shower only and avoid sitting in submerged water, aside from sitz baths Wear a well-fitting bra that provides breast support. Eat healthy foods. Drink enough fluids to keep your urine clear or pale yellow. Eat high-fiber foods such as whole grain cereals and breads, brown rice, beans, and fresh fruits and vegetables every day. These foods may help prevent or relieve constipation. Avoid constipation with high fiber foods or medications, such as miralax or metamucil Follow your caregiver's recommendations regarding resumption of activities such as climbing stairs, driving, lifting, exercising, or traveling. Talk to your caregiver about resuming sexual activities. Resumption of sexual activities is dependent upon your risk of infection, your rate of healing, and your comfort and desire to resume sexual activity. Try to have someone help you with your household activities and your newborn for at least a few days after you leave the hospital. Rest as much as possible. Try to rest or  take a nap when your newborn is sleeping. Increase your activities gradually. Keep all of your scheduled postpartum appointments. It is very important to keep your scheduled follow-up appointments. At these appointments, your caregiver will be checking to make sure that you are healing physically and emotionally. SEEK MEDICAL CARE IF:  You are passing large clots from your vagina. Save any clots to show your caregiver. You have a foul smelling discharge from your vagina. You have trouble urinating. You are urinating frequently. You have pain when you urinate. You have a change in your bowel movements. You have increasing redness, pain, or swelling near your vaginal incision (episiotomy) or vaginal tear. You have pus draining from your episiotomy or vaginal tear. Your episiotomy or vaginal tear is separating. You have painful, hard, or reddened breasts. You have a severe headache. You have blurred vision or see spots. You feel sad or depressed. You have thoughts of hurting yourself or your newborn. You have questions about your care, the care of your newborn, or medicines. You are dizzy or light-headed. You have a rash. You have nausea or vomiting. You were breastfeeding and have not had a menstrual period within 12 weeks after you stopped breastfeeding. You are not breastfeeding and have not had a menstrual period by the 12th week after delivery. You have a fever. SEEK IMMEDIATE MEDICAL CARE IF:  You have persistent pain. You have chest pain. You have shortness of breath. You faint. You have leg pain. You have stomach pain. Your vaginal bleeding saturates two or more sanitary pads in 1 hour. MAKE SURE YOU:  Understand these instructions. Will watch your condition. Will get help right away if you are not doing well or  get worse. Document Released: 06/27/2000 Document Revised: 11/14/2013 Document Reviewed: 02/25/2012 [facility] Patient Information 2015 [place], [place]. This  information is not intended to replace advice given to you by your health care provider. Make sure you discuss any questions you have with your health care provider.  Sitz Bath A sitz bath is a warm water bath taken in the sitting position. The water covers only the hips and butt (buttocks). We recommend using one that fits in the toilet, to help with ease of use and cleanliness. It may be used for either healing or cleaning purposes. Sitz baths are also used to relieve pain, itching, or muscle tightening (spasms). The water may contain medicine. Moist heat will help you heal and relax.  HOME CARE  Take 3 to 4 sitz baths a day. Fill the bathtub half-full with warm water. Sit in the water and open the drain a little. Turn on the warm water to keep the tub half-full. Keep the water running constantly. Soak in the water for 15 to 20 minutes. After the sitz bath, pat the affected area dry. GET HELP RIGHT AWAY IF: You get worse instead of better. Stop the sitz baths if you get worse. MAKE SURE YOU: Understand these instructions. Will watch your condition. Will get help right away if you are not doing well or get worse. Document Released: 08/07/2004 Document Revised: 03/24/2012 Document Reviewed: 10/28/2010 [facility] Patient Information 2015 [place], [place]. This information is not intended to replace advice given to you by your health care provider. Make sure you discuss any questions you have with your health care provider.

## 2024-01-28 NOTE — Lactation Note (Signed)
 This note was copied from a baby's chart. Lactation Consultation Note  Patient Name: Kathleen Turner Today's Date: 01/28/2024 Age:22 hours Reason for consult: Initial assessment;Primapara;1st time breastfeeding   Maternal Data Has patient been taught Hand Expression?: No (IBCLC to assess next feed/provide education during follow up) Does the patient have breastfeeding experience prior to this delivery?: No G1P1 MOB very tired and falling asleep intermittently during LC room entry. Infant swaddled and sleeping in bassinet, no feeding observed per dyad resting following birth as appropriate at [facility]. Feeding Mother's Current Feeding Choice: Breast Milk and Formula MOB plan to combo feed with infant being offered breast before supplementing with formula. LC provided brief education about feeding frequency and infant stomach capacity on first 24 hours of life as well as normative infant behavior and WTE for monitoring diaper output, early and late stage hunger cues, prioritization of rest for dyad, and typical onset of infant cluster feeding to influence milk maturation and regulate maternal supply.    Lactation Tools Discussed/Used  Will return for feed assessment PRN upon MOB request. LC name and phone number updated in pt room.   Interventions Interventions: Breast feeding basics reviewed;Education  Discharge Discharge Education: Warning signs for feeding baby;Outpatient recommendation  Consult Status Consult Status: Follow-up Date: 01/28/24 (Assessment of feeding incomplete, awaiting MOB request or follow up before end of day) Follow-up type: In-patient    Kathleen Turner 01/28/2024, 2:33 PM

## 2024-01-28 NOTE — Lactation Note (Signed)
 This note was copied from a baby's chart. Lactation Consultation Note  Patient Name: Kathleen Turner Today's Date: 01/28/2024 Age:22 hours Reason for consult: Follow-up assessment;Primapara;Term Mother of approximate 35 hour old baby was about to give baby a formula bottle when LC arrived. Physiology of milk production via breast stimulation as well as infant stomach size over the first week discussed. Mom did not feel that she had milk available for baby but was able to hand express drops.   Maternal Data Does the patient have breastfeeding experience prior to this delivery?: No  Feeding Mother's Current Feeding Choice: Breast Milk and Formula  LATCH Score Latch: Repeated attempts needed to sustain latch, nipple held in mouth throughout feeding, stimulation needed to elicit sucking reflex.  Audible Swallowing: A few with stimulation  Type of Nipple: Everted at rest and after stimulation  Comfort (Breast/Nipple): Soft / non-tender  Hold (Positioning): Assistance needed to correctly position infant at breast and maintain latch.  LATCH Score: 7   Lactation Tools Discussed/Used    Interventions Interventions: Breast feeding basics reviewed;Assisted with latch;Adjust position;Support pillows;Position options;Hand express;Education;Infant Driven Feeding Algorithm education Attempted latch on right breast in cradle hold with pillow support while mom was seated in chair at bedside. No latch achieved after 5-7 minutes. Baby repositioned to left breast and latched with LC support and demonstrating technique of sandwiching the areola. Consistent suckling with occasional audible swallows achieved. Baby burped and was moved to the right breast for another 5 minutes of feeding. The football hold was demonstrated as well. LC explained 8-12 feedings in 24 hours necessary, expected output over the first week, recommendation to put baby to breast first prior to using a bottle, pillow support,  infant stomach size, engorgement relief, and hand expression.   Discharge Discharge Education: Engorgement and breast care;Warning signs for feeding baby WIC Program: Yes  Consult Status Consult Status: PRN    [name] 01/28/2024, 7:56 PM

## 2024-01-28 NOTE — Plan of Care (Signed)
  Problem: Education: Goal: Knowledge of Childbirth will improve 01/28/2024 0724 by Derhonda Eastlick M, RN Outcome: Progressing 01/28/2024 0723 by Yiannis Tulloch M, RN Outcome: Progressing Goal: Ability to make informed decisions regarding treatment and plan of care will improve 01/28/2024 0724 by Eann Cleland M, RN Outcome: Progressing 01/28/2024 0723 by Ethelmae Ringel M, RN Outcome: Progressing Goal: Ability to state and carry out methods to decrease the pain will improve 01/28/2024 0724 by Mal Asher M, RN Outcome: Progressing 01/28/2024 0723 by Reiner Loewen M, RN Outcome: Progressing Goal: Individualized Educational Video(s) 01/28/2024 0724 by Laveta Gilkey M, RN Outcome: Progressing 01/28/2024 0723 by Kavina Cantave M, RN Outcome: Progressing   Problem: Coping: Goal: Ability to verbalize concerns and feelings about labor and delivery will improve 01/28/2024 0724 by Maalle Starrett M, RN Outcome: Progressing 01/28/2024 0723 by Beuford Garcilazo M, RN Outcome: Progressing   Problem: Life Cycle: Goal: Ability to make normal progression through stages of labor will improve 01/28/2024 0724 by Ruthmary Occhipinti M, RN Outcome: Progressing 01/28/2024 0723 by Emberlyn Burlison M, RN Outcome: Progressing Goal: Ability to effectively push during vaginal delivery will improve 01/28/2024 0724 by Kayleanna Lorman M, RN Outcome: Progressing 01/28/2024 0723 by Hamda Klutts M, RN Outcome: Progressing   Problem: Role Relationship: Goal: Will demonstrate positive interactions with the child 01/28/2024 0724 by Kaylib Furness M, RN Outcome: Progressing 01/28/2024 0723 by Shavonne Ambroise M, RN Outcome: Progressing   Problem: Safety: Goal: Risk of complications during labor and delivery will decrease 01/28/2024 0724 by Kristoffer Bala M, RN Outcome: Progressing 01/28/2024 0723 by Hector Taft M, RN Outcome: Progressing   Problem:  Pain Management: Goal: Relief or control of pain from uterine contractions will improve 01/28/2024 0724 by Hendricks Schwandt M, RN Outcome: Progressing 01/28/2024 0723 by Sienna Stonehocker M, RN Outcome: Progressing   Problem: Education: Goal: Knowledge of General Education information will improve Description: Including pain rating scale, medication(s)/side effects and non-pharmacologic comfort measures Outcome: Progressing   Problem: Health Behavior/Discharge Planning: Goal: Ability to manage health-related needs will improve Outcome: Progressing   Problem: Clinical Measurements: Goal: Ability to maintain clinical measurements within normal limits will improve Outcome: Progressing Goal: Will remain free from infection Outcome: Progressing Goal: Diagnostic test results will improve Outcome: Progressing Goal: Respiratory complications will improve Outcome: Progressing Goal: Cardiovascular complication will be avoided Outcome: Progressing   Problem: Activity: Goal: Risk for activity intolerance will decrease Outcome: Progressing   Problem: Nutrition: Goal: Adequate nutrition will be maintained Outcome: Progressing   Problem: Coping: Goal: Level of anxiety will decrease Outcome: Progressing   Problem: Elimination: Goal: Will not experience complications related to bowel motility Outcome: Progressing Goal: Will not experience complications related to urinary retention Outcome: Progressing   Problem: Pain Managment: Goal: General experience of comfort will improve and/or be controlled Outcome: Progressing   Problem: Safety: Goal: Ability to remain free from injury will improve Outcome: Progressing   Problem: Skin Integrity: Goal: Risk for impaired skin integrity will decrease Outcome: Progressing

## 2024-01-28 NOTE — Plan of Care (Signed)

## 2024-01-28 NOTE — TOC Initial Note (Signed)
 Transition of Care [facility]) - Initial/Assessment Note    Patient Details  Name: Kathleen Turner MRN: [id number] Date of Birth: Jun 14, 2001  Transition of Care [facility]) CM/SW Contact:    Corrie JINNY Ruts, LCSW Phone Number: 01/28/2024, 4:09 PM  Clinical Narrative:                 Chart Reviewed. Assessment has been completed. I provided resources for the patient.   TOC will follow until discharge.         Patient Goals and CMS Choice            Expected Discharge Plan and Services                                              Prior Living Arrangements/Services                       Activities of Daily Living   ADL Screening (condition at time of admission) Independently performs ADLs?: Yes (appropriate for developmental age) Is the patient deaf or have difficulty hearing?: No Does the patient have difficulty seeing, even when wearing glasses/contacts?: No Does the patient have difficulty concentrating, remembering, or making decisions?: No  Permission Sought/Granted                  Emotional Assessment              Admission diagnosis:  Amniotic fluid leaking [O42.90] Patient Active Problem List   Diagnosis Date Noted   Amniotic fluid leaking 01/28/2024   Susceptible to Varicella (non-immune), currently pregnant in third trimester 01/28/2024   Adjustment disorder with anxiety 01/28/2024   Positive GBS test 01/28/2024   NSVD (normal spontaneous vaginal delivery) 01/28/2024   Acute appendicitis 02/22/2022   Left ovarian cyst 08/27/2020   Postoperative state 08/27/2020   PCP:  Freddrick, No Pharmacy:   [facility] [address]), [place] - [address] [place]) [place] [address] Phone: [phone] Fax: [phone]     Social Drivers of Health (SDOH) Social History: SDOH Screenings   Food Insecurity: No Food Insecurity (01/28/2024)  Housing: Low Risk  (01/28/2024)  Transportation  Needs: No Transportation Needs (01/28/2024)  Utilities: Not At Risk (01/28/2024)  Tobacco Use: Low Risk  (01/28/2024)   SDOH Interventions:     Readmission Risk Interventions     No data to display

## 2024-01-28 NOTE — H&P (Addendum)
 OB History & Physical   History of Present Illness:  Chief Complaint:   HPI:  Kathleen Turner is a 23 y.o. G1P0 female at [redacted]w[redacted]d dated by LMP.  She presents to L&D for amniotic fluid leaking. She reports experiencing her water break around 0340 and is now experiencing contractions. She endorses good fetal movement and denies vaginal bleeding.  Pregnancy Issues: 1. GBS positive via urine in 1st trimester 2. Varicella non-immune 3. Adjustment disorder with anxiety   Maternal Medical History:  History reviewed. No pertinent past medical history.  Past Surgical History:  Procedure Laterality Date   LAPAROSCOPIC OVARIAN CYSTECTOMY Left 08/27/2020   Procedure: LAPAROSCOPIC DETORSION LEFT ADNEXAL CYST LEFT ADNEXAL CYSTECTOMY;  Surgeon: Schermerhorn, Debby PARAS, MD;  Location: [facility] ORS;  Service: Gynecology;  Laterality: Left;   XI ROBOTIC LAPAROSCOPIC ASSISTED APPENDECTOMY N/A 02/22/2022   Procedure: XI ROBOTIC LAPAROSCOPIC ASSISTED APPENDECTOMY;  Surgeon: Lane Shope, MD;  Location: [facility] ORS;  Service: General;  Laterality: N/A;    No Known Allergies  Prior to Admission medications   Medication Sig Start Date End Date Taking? Authorizing Provider  Prenatal Vit-Fe Fumarate-FA (PRENATAL MULTIVITAMIN) TABS tablet Take 1 tablet by mouth daily at 12 noon.   Yes [provider]  ibuprofen  (ADVIL ) 600 MG tablet Take 1 tablet (600 mg total) by mouth every 6 (six) hours as needed. Patient not taking: Reported on 01/28/2024 02/25/22   Terryl Arthea SAUNDERS, PA-C    Prenatal care site:  Kathleen Turner  Social History: She  reports that she has never smoked. She has never used smokeless tobacco. She reports that she does not drink alcohol and does not use drugs.  Family History: family history is not on file.   Review of Systems: A full review of systems was performed and negative except as noted in the HPI.     Physical Exam:  Vital Signs: BP 114/66   Pulse 94   Temp 98.2 F (36.8 C)  (Oral)   Resp 18   Ht 5' 2 (1.575 m)   Wt 79.4 kg   LMP 04/30/2023   BMI 32.01 kg/m  General: no acute distress.  HEENT: normocephalic, atraumatic Heart: regular rate & rhythm.  No murmurs/rubs/gallops Lungs: clear to auscultation bilaterally, normal respiratory effort Abdomen: soft, gravid, non-tender;  EFW: 7lb Pelvic:   External: Normal external female genitalia  Cervix: Dilation: 3.5 / Effacement (%): 70 / Station: -2    Extremities: non-tender, symmetric, mild edema bilaterally.  DTRs: +2  Neurologic: Alert & oriented x 3.    Results for orders placed or performed during the hospital encounter of 01/28/24 (from the past 24 hours)  CBC     Status: Abnormal   Collection Time: 01/28/24  6:04 AM  Result Value Ref Range   WBC 13.9 (H) 4.0 - 10.5 K/uL   RBC 4.34 3.87 - 5.11 MIL/uL   Hemoglobin 11.3 (L) 12.0 - 15.0 g/dL   HCT 65.2 (L) 63.9 - 53.9 %   MCV 80.0 80.0 - 100.0 fL   MCH 26.0 26.0 - 34.0 pg   MCHC 32.6 30.0 - 36.0 g/dL   RDW 83.1 (H) 88.4 - 84.4 %   Platelets 314 150 - 400 K/uL   nRBC 0.0 0.0 - 0.2 %  Type and screen [facility] : [facility]     Status: None (Preliminary result)   Collection Time: 01/28/24  6:11 AM  Result Value Ref Range   ABO/RH(D) PENDING    Antibody Screen PENDING    Sample  Expiration      01/31/2024,2359 Performed at [facility], [address] [place], [place] [address]     Pertinent Results:  Prenatal Labs: Blood type/Rh O positive  Antibody screen neg  Rubella Immune  Varicella Non-Immune  RPR NR  HBsAg Neg  HIV NR  GC neg  Chlamydia neg  Genetic screening NIPT WNL  1 hour GTT 101  3 hour GTT   GBS Positive by NOB urine   FHT: 145bpm, moderate variability, accelerations present, intermittent variable decelerations, Category II tracing TOCO: contractions q3-75min SVE:  Dilation: 3.5 / Effacement (%): 70 / Station: -2    Cephalic by leopolds  No results found.  Assessment:  Kathleen Turner is a 23  y.o. G1P0 female at [redacted]w[redacted]d with SROM and uterine contractions.   Plan:  1. Admit to Labor & Delivery; consents reviewed and obtained - Dr. Leonce notified of admission and plan of care  2. Fetal Well being  - Fetal Tracing: Category II tracing, intermittent variable decelerations, improved with no further decelerations after repositioning from her back to her side - Group B Streptococcus ppx indicated: treat with PCN - Presentation: vertex confirmed by SVE   3. Routine OB: - Prenatal labs reviewed, as above - Rh positive - CBC, T&S, RPR on admit - Clear fluids, saline lock  4. Monitoring of Labor -  Contractions q3-96min, external toco in place -  Pelvis adequate for trial of labor -  Plan for augmentation with pitocin  if needed -  Plan for continuous fetal monitoring  -  Maternal pain control as desired; requesting IVPM,  regional anesthesia - Anticipate vaginal delivery  5. Post Partum Planning: - Infant feeding: breastfeeding - Contraception: Nexplanon - Tdap: received AP - Flu: declined  6. Adjustment disorder with anxiety: - no medications - 2wk mood check with Kathleen Turner, CNM 01/28/24 7:29 AM

## 2024-01-28 NOTE — Discharge Summary (Signed)
 Postpartum Discharge Summary  Patient Name: Kathleen Turner DOB: [birth year] MRN: [id number]  Date of admission: 01/28/2024 Delivery date:01/28/2024 Delivering provider: MACKIE, ANNA Turner Date of discharge: 01/30/2024  Primary OB: Kathleen Turner OFE:Ejupzwu'd last menstrual period was 05/03/2023 (approximate). EDC Estimated Date of Delivery: 02/04/24 Gestational Age at Delivery: [redacted]w[redacted]d   Admitting diagnosis: Amniotic fluid leaking [O42.90] Intrauterine pregnancy: [redacted]w[redacted]d     Secondary diagnosis:   Principal Problem:   Amniotic fluid leaking Active Problems:   Susceptible to Varicella (non-immune), currently pregnant in third trimester   Adjustment disorder with anxiety   Positive GBS test   NSVD (normal spontaneous vaginal delivery)   Discharge Diagnosis: Term Pregnancy Delivered      Hospital course: Onset of Labor With Vaginal Delivery      23 y.o. yo G1P1001 at [redacted]w[redacted]d was admitted in Active Labor on 01/28/2024. Labor course was uncomplicated. Kathleen Turner presented to L&D with SROM and active labor. She was expectantly managed. She progressed quickly to C/C/+2 with a strong urge to push.  She pushed effectively over approximately 20 minutes for a spontaneous vaginal birth.  Membrane Rupture Time/Date: 3:40 AM,01/28/2024  Delivery Method:Vaginal, Spontaneous Operative Delivery:N/A Episiotomy: None Lacerations:  2nd degree;Perineal Patient had a postpartum course complicated by iron  deficiency anemia d/t acute blood loss. She was given IV Venofer  300 mg once and started on oral iron  supplements twice a day.  She is ambulating, tolerating a regular diet, passing flatus, and urinating well. Patient is discharged home in stable condition on 01/30/24.  Newborn Data: Birth date:01/28/2024 Birth time:9:15 AM Gender:Female Living status:Living Apgars:9 ,9  Weight:2650 g                                            Post partum procedures:IV Venofer  Augmentation:: N/A Complications: None Delivery  Type: spontaneous vaginal delivery Anesthesia: None Placenta: spontaneous To Pathology: No   Prenatal Labs:  Blood type/Rh O positive  Antibody screen neg  Rubella Immune  Varicella Non-Immune  RPR NR  HBsAg Neg  HIV NR  GC neg  Chlamydia neg  Genetic screening NIPT WNL  1 hour GTT 101  3 hour GTT    GBS Positive by NOB urine    Magnesium Sulfate received: No BMZ received: No Rhophylac:was not indicated MMR: was not indicated Varivax vaccine given: was given - Tdap vaccine: Given prenatally - Flu vaccine: Not in season -RSV vaccine: Not in season   Transfusion:Yes IV Venofer   Physical exam  Vitals:   01/29/24 0841 01/29/24 1614 01/29/24 2357 01/30/24 0815  BP: 104/69 123/75 109/65 101/72  Pulse: 63 93  80  Resp: 18  18 18   Temp: 97.7 F (36.5 C) 98.6 F (37 C) 98.8 F (37.1 C) 97.8 F (36.6 C)  TempSrc: Oral Oral Oral Oral  SpO2: 100% 100%  98%  Weight:      Height:       General: alert, cooperative, and no distress Lochia: appropriate Uterine Fundus: firm Perineum: minimal edema/repair well approximated DVT Evaluation: No evidence of DVT seen on physical exam. Negative Homan's sign. No cords or calf tenderness. No significant calf/ankle edema.  Labs: Lab Results  Component Value Date   WBC 14.6 (H) 01/29/2024   HGB 8.9 (L) 01/29/2024   HCT 27.3 (L) 01/29/2024   MCV 82.0 01/29/2024   PLT 239 01/29/2024      Latest Ref Rng & Units 03/08/2023  3:59 AM  CMP  Glucose 70 - 99 mg/dL 92   BUN 6 - 20 mg/dL 11   Creatinine 9.55 - 1.00 mg/dL 9.38   Sodium 864 - 854 mmol/L 139   Potassium 3.5 - 5.1 mmol/L 3.4   Chloride 98 - 111 mmol/L 112   CO2 22 - 32 mmol/L 20   Calcium 8.9 - 10.3 mg/dL 8.3   Total Protein 6.5 - 8.1 g/dL 7.4   Total Bilirubin 0.3 - 1.2 mg/dL 0.3   Alkaline Phos 38 - 126 U/L 57   AST 15 - 41 U/L 35   ALT 0 - 44 U/L 34    Edinburgh Score:    01/29/2024   12:42 AM  Edinburgh Postnatal Depression Scale Screening Tool  I have  been able to laugh and see the funny side of things. 0  I have looked forward with enjoyment to things. 0  I have blamed myself unnecessarily when things went wrong. 0  I have been anxious or worried for no good reason. 0  I have felt scared or panicky for no good reason. 0  Things have been getting on top of me. 0  I have been so unhappy that I have had difficulty sleeping. 0  I have felt sad or miserable. 0  I have been so unhappy that I have been crying. 0  The thought of harming myself has occurred to me. 0  Edinburgh Postnatal Depression Scale Total 0    Risk assessment for postpartum VTE and prophylactic treatment: Very high risk factors: None High risk factors: None Moderate risk factors: None  Postpartum VTE prophylaxis with LMWH not indicated  After visit meds:  Allergies as of 01/30/2024   No Known Allergies      Medication List     STOP taking these medications    prenatal multivitamin Tabs tablet       TAKE these medications    acetaminophen  500 MG tablet Commonly known as: TYLENOL  Take 2 tablets (1,000 mg total) by mouth every 6 (six) hours.   benzocaine -Menthol  20-0.5 % Aero Commonly known as: DERMOPLAST Apply 1 Application topically as needed for irritation (perineal discomfort).   coconut oil Oil Apply 1 Application topically as needed.   dibucaine 1 % Oint Commonly known as: NUPERCAINAL Place 1 Application rectally as needed for hemorrhoids.   ferrous sulfate  325 (65 FE) MG tablet Take 1 tablet (325 mg total) by mouth 2 (two) times daily with a meal.   ibuprofen  600 MG tablet Commonly known as: ADVIL  Take 1 tablet (600 mg total) by mouth every 6 (six) hours.   senna-docusate 8.6-50 MG tablet Commonly known as: Senokot-S Take 2 tablets by mouth daily.   simethicone  80 MG chewable tablet Commonly known as: MYLICON Chew 1 tablet (80 mg total) by mouth as needed for flatulence.   witch hazel-glycerin  pad Commonly known as: TUCKS Apply  1 Application topically as needed for hemorrhoids.       Discharge home in stable condition Infant Feeding: Bottle and Breast Infant Disposition:home with mother Discharge instruction: per After Visit Summary and Postpartum booklet. Activity: Advance as tolerated. Pelvic rest for 6 weeks.  Diet: iron  rich diet Anticipated Birth Control:  Contraceptives: Nexplanon  Postpartum Appointment:6 weeks Additional Postpartum F/U: Postpartum Depression checkup Future Appointments:No future appointments. Follow up Visit:  Follow-up Information     [facility], [facility] Follow up in 2 week(s).   Specialty: General Practice Why: postpartum mood check. Can follow up with [facility] if preferred. Contact information: [address] [place] [address] [phone]         Vernel Therisa HERO, CNM. Schedule an appointment as soon as possible for a visit.   Specialty: Certified Nurse Midwife Why: as needed for postpartum follow up at 2 weeks for mood check and 6 weeks for postpartum appointment and Nexplanon  insertion. Can follow up with Kathleen Turner if preferred. Contact information: [address] [place] [address] [phone]                 Plan:  Cayla Wiegand was discharged to home in good condition. Follow-up appointment as directed.    Signed: Bobbette Brunswick CNM

## 2024-01-28 NOTE — Clinical Social Work Maternal (Signed)
 CLINICAL SOCIAL WORK MATERNAL/CHILD NOTE  Patient Details  Name: Kathleen Turner MRN: [id number] Date of Birth: [birth year]  Date:  01/28/2024  Clinical Social Worker Initiating Note:  Corrie Ruts Date/Time: Initiated:  01/28/24/0230     Child's Name:  Kathleen Turner   Biological Parents:  Mother   Need for Interpreter:  None   Reason for Referral:  Behavioral Health Concerns   Address:  [address] [place] [address]    Phone number:  [phone] (home)     Additional phone number:   Household Members/Support Persons (HM/SP):   Household Member/Support Person 1, Household Member/Support Person 2, Household Member/Support Person 3, Household Member/Support Person 4   HM/SP Name Relationship DOB or Age  HM/SP -1 Kenneth Hightower Mother 50  HM/SP -2 Curlee Grain Step Father 41  HM/SP -3 John Brother 11  HM/SP -4 Kevin Brother 15  HM/SP -5        HM/SP -6        HM/SP -7        HM/SP -8          Natural Supports (not living in the home):      Professional Supports:     Employment: Environmental education officer   Type of Work: [employer]   Education:  High school graduate   Homebound arranged:    Surveyor, quantity Resources:      Other Resources:  [facility] (Patient reports that she will apply for food stamps.)   Cultural/Religious Considerations Which May Impact Care:    Strengths:  Ability to meet basic needs  , Compliance with medical plan  , Home prepared for child  , Pediatrician chosen   Psychotropic Medications:         Pediatrician:    [facility]  Pediatrician List:   [facility] Other (Patient reports setting up care with Carlin Blamer. Patient reports that she will schedule Kathleen girl Isley follow up appointment.)  [facility]      Pediatrician Fax Number:    Risk Factors/Current Problems:  Mental Health Concerns     Cognitive State:  Able to Concentrate  , Alert     Mood/Affect:  Calm   , Comfortable     CSW Assessment:  Chart reviewed. I spoke with Ms. Guerline today at bedside. I introduced myself, role, the reason for consult, and HIPAA. Patient mother was present and the patient allowed the mother to be present in the room during consult.   I consulted with Ms. Willette about behavioral health concerns. Per chart review and H&P patient has Adjustment disorder w/ Anxiety. However, during the consult patient denies any behavioral health concerns.   Ms. Tonelli informed me that she was doing good after delivery. She confirmed that her address is [address], [place] [address] and her telephone number is [phone]. Ms. Poullard reports being supported my her mother, step father, and brothers at home. The patient reports living with mother, Kenneth (71), Stepfather, Curlee Grain (58), brother, Curlee (39), and brother, Franky (15).   Patient reports that her highest level of education is high school and she currently works at [employer]. Patient reports no behavioral health history.   Ms. Berkland denies any current SI/HI/DV.  The Patient reports that she will set of care for Kathleen Girl Allers with Carlin Blamer and will call to make an initial appointment.   Patient reports having a crib,  bassinet, diapers, clothes, and car seat.   I reviewed information about Post Partum Depression, Sudden Infant Death Syndrome, Designer, fashion/clothing, and Safe Sleep Environment. Patient verbalized understanding.   Patient reports that her mother will assist during discharge.   TOC will continue to follow if any additional services are needed.    CSW Plan/Description:  Sudden Infant Death Syndrome (SIDS) Education, Perinatal Mood and Anxiety Disorder (PMADs) Education    Corrie JINNY Ruts, LCSW 01/28/2024, 3:47 PM

## 2024-01-29 LAB — CBC
HCT: 27.3 % — ABNORMAL LOW (ref 36.0–46.0)
Hemoglobin: 8.9 g/dL — ABNORMAL LOW (ref 12.0–15.0)
MCH: 26.7 pg (ref 26.0–34.0)
MCHC: 32.6 g/dL (ref 30.0–36.0)
MCV: 82 fL (ref 80.0–100.0)
Platelets: 239 K/uL (ref 150–400)
RBC: 3.33 MIL/uL — ABNORMAL LOW (ref 3.87–5.11)
RDW: 17.1 % — ABNORMAL HIGH (ref 11.5–15.5)
WBC: 14.6 K/uL — ABNORMAL HIGH (ref 4.0–10.5)
nRBC: 0.1 % (ref 0.0–0.2)

## 2024-01-29 MED ORDER — DIPHENHYDRAMINE HCL 50 MG/ML IJ SOLN: 25.0000 mg | Freq: Once | INTRAMUSCULAR | Status: DC | PRN

## 2024-01-29 MED ORDER — METHYLPREDNISOLONE SODIUM SUCC 125 MG IJ SOLR
125.0000 mg | Freq: Once | INTRAMUSCULAR | Status: DC | PRN
  Filled 2024-01-29: qty 2

## 2024-01-29 MED ORDER — SODIUM CHLORIDE 0.9 % IV BOLUS: 500.0000 mL | Freq: Once | INTRAVENOUS | Status: DC | PRN

## 2024-01-29 MED ORDER — EPINEPHRINE 0.3 MG/0.3ML IJ SOAJ
0.3000 mg | Freq: Once | INTRAMUSCULAR | Status: DC | PRN
  Filled 2024-01-29: qty 0.3

## 2024-01-29 MED ORDER — SODIUM CHLORIDE 0.9 % IV SOLN: INTRAVENOUS | Status: AC | PRN

## 2024-01-29 MED ORDER — ACETAMINOPHEN 325 MG PO TABS
650.0000 mg | ORAL_TABLET | Freq: Four times a day (QID) | ORAL | Status: DC | PRN
  Filled 2024-01-29: qty 2

## 2024-01-29 MED ORDER — ALBUTEROL SULFATE (2.5 MG/3ML) 0.083% IN NEBU: 2.5000 mg | INHALATION_SOLUTION | Freq: Once | RESPIRATORY_TRACT | Status: DC | PRN

## 2024-01-29 MED ORDER — IRON SUCROSE 300 MG IVPB - SIMPLE MED
300.0000 mg | Freq: Once | Status: AC
  Administered 2024-01-29: 300 mg via INTRAVENOUS
  Filled 2024-01-29: qty 300

## 2024-01-29 NOTE — Progress Notes (Signed)
 Post Partum Day 1 Subjective: Doing well, no complaints.  Tolerating regular diet, pain with PO meds, voiding and ambulating without difficulty.  No CP SOB Fever,Chills, N/V or leg pain; denies nipple or breast pain, no HA change of vision, RUQ/epigastric pain  Objective: BP 104/69 (BP Location: Right Arm)   Pulse 63   Temp 97.7 F (36.5 C) (Oral)   Resp 18   Ht 5' 2 (1.575 m)   Wt 79.4 kg   LMP 05/03/2023 (Approximate)   SpO2 100%   Breastfeeding Unknown   BMI 32.01 kg/m    Physical Exam:  General: NAD Breasts: soft/nontender CV: RRR Pulm: nl effort, CTABL Abdomen: soft, NT, BS x 4 Perineum:  minimal edema, repair well approximated Lochia: moderate Uterine Fundus: fundus firm and 1 fb below umbilicus DVT Evaluation: no cords, ttp LEs   Recent Labs    01/28/24 0604 01/29/24 0505  HGB 11.3* 8.9*  HCT 34.7* 27.3*  WBC 13.9* 14.6*  PLT 314 239    Assessment/Plan: 22 y.o. G1P1001 postpartum day # 1  - Continue routine PP care - Lactation consult PRN  - Discussed contraceptive options including implant, IUDs hormonal and non-hormonal, injection, pills/ring/patch, condoms, and NFP.  - Acute blood loss anemia, clinically significant - hemoglobin changed from 11.3 to 8.9, patient is asymptomatic, hemodynamically stable; start po ferrous sulfate  BID with stool softeners, administer Venofer IV  - Immunization status: Needs varicella prior to DC  Disposition: Does not desire Dc home today.   Edsel Charlies Blush, CNM 01/29/2024 9:38 AM

## 2024-01-30 MED ORDER — IBUPROFEN 600 MG PO TABS: 600.0000 mg | ORAL_TABLET | Freq: Four times a day (QID) | ORAL | 0 refills | Status: AC

## 2024-01-30 MED ORDER — SIMETHICONE 80 MG PO CHEW: 80.0000 mg | CHEWABLE_TABLET | ORAL | Status: AC | PRN

## 2024-01-30 MED ORDER — WITCH HAZEL-GLYCERIN EX PADS: 1.0000 | MEDICATED_PAD | CUTANEOUS | Status: AC | PRN

## 2024-01-30 MED ORDER — COCONUT OIL OIL: 1.0000 | TOPICAL_OIL | Status: AC | PRN

## 2024-01-30 MED ORDER — DIBUCAINE (PERIANAL) 1 % EX OINT: 1.0000 | TOPICAL_OINTMENT | CUTANEOUS | Status: AC | PRN

## 2024-01-30 MED ORDER — SENNOSIDES-DOCUSATE SODIUM 8.6-50 MG PO TABS: 2.0000 | ORAL_TABLET | Freq: Every day | ORAL | Status: AC

## 2024-01-30 MED ORDER — ETONOGESTREL 68 MG ~~LOC~~ IMPL
68.0000 mg | DRUG_IMPLANT | Freq: Once | SUBCUTANEOUS | Status: AC
  Administered 2024-01-30: 68 mg via SUBCUTANEOUS
  Filled 2024-01-30 (×2): qty 1

## 2024-01-30 MED ORDER — BENZOCAINE-MENTHOL 20-0.5 % EX AERO: 1.0000 | INHALATION_SPRAY | CUTANEOUS | Status: AC | PRN

## 2024-01-30 MED ORDER — ACETAMINOPHEN 500 MG PO TABS: 1000.0000 mg | ORAL_TABLET | Freq: Four times a day (QID) | ORAL | Status: AC

## 2024-01-30 MED ORDER — FERROUS SULFATE 325 (65 FE) MG PO TABS: 325.0000 mg | ORAL_TABLET | Freq: Two times a day (BID) | ORAL | Status: AC

## 2024-01-30 MED ORDER — LIDOCAINE HCL 1 % IJ SOLN
0.0000 mL | Freq: Once | INTRAMUSCULAR | Status: AC | PRN
  Administered 2024-01-30: 10 mL via INTRADERMAL
  Filled 2024-01-30: qty 20

## 2024-01-30 NOTE — Procedures (Signed)
  GYNECOLOGY PROCEDURE NOTE  Patient is a 23 y.o. G1P1001 presenting for Nexplanon  insertion as her desires means of contraception.  She provided informed consent, signed copy in the chart, time out was performed. Patient is 2 days postpartum. LMP of Patient's last menstrual period was 05/03/2023 (approximate).  She understands that Nexplanon  is a progesterone only therapy, and that patients often patients have irregular and unpredictable vaginal bleeding or amenorrhea. She understands that other side effects are possible related to systemic progesterone, including but not limited to, headaches, breast tenderness, nausea, and irritability. While effective at preventing pregnancy long acting reversible contraceptives do not prevent transmission of sexually transmitted diseases and use of barrier methods for this purpose was discussed. The placement procedure for Nexplanon  was reviewed with the patient in detail including risks of nerve injury, infection, bleeding and injury to other muscles or tendons. She understands that the Nexplanon  implant is good for 3 years and needs to be removed at the end of that time.  She understands that Nexplanon  is an extremely effective option for contraception, with failure rate of <1%. This information is reviewed today and all questions were answered. Informed consent was obtained, both verbally and written.   The patient is healthy and has no contraindications to Nexplanon  use. Urine pregnancy test was performed today and was negative.  Procedure Appropriate time out taken.  Patient placed in dorsal supine with right arm above head, elbow flexed at 90 degrees, arm resting on examination table with hand behind her head.  The bicipital grove was palpated and site 8-10cm proximal to the medial epicondyle was indentified.  Per the manufacturer's recommendations, the insertion site was marked along a line 3-5 cm posterior (toward the triceps) to the bicipital groove and at  8-10 cm medial to the medial epicondyle. The insertion site was prepped with Chloraprep swab and then injected with 5 cc of 1% lidocaine  without epinephrine .  Nexplanon  removed form sterile blister packaging,  Device confirmed in needle, before inserting full length of needle, tenting up the skin as the needle was advance.  The drug eluting rod was then deployed by pulling back the slider per the manufactures recommendation. Lot# [id number] EXP: 12/2025 The implant was palpable by the clinician as well as the patient.  The insertion site covered dressed with a 1/2 steri-strip before applying  a kerlex bandage pressure dressing..Minimal blood loss was noted during the procedure.  The patient tolerated the procedure well.   She was instructed to wear the bandage for 24 hours, call with any signs of infection.  She was given the Nexplanon  card and instructed to have the rod removed in 3 years.  Bobbette Brunswick CNM
